# Patient Record
Sex: Male | Born: 1985 | Race: Black or African American | Hispanic: No | Marital: Single | Smoking: Never smoker
Health system: Southern US, Community
[De-identification: ages and names within clinical notes are randomized; demographics above are authoritative.]

## PROBLEM LIST (undated history)

## (undated) DIAGNOSIS — J45909 Unspecified asthma, uncomplicated: Secondary | ICD-10-CM

## (undated) HISTORY — DX: Unspecified asthma, uncomplicated: J45.909

---

## 2013-08-18 ENCOUNTER — Emergency Department (HOSPITAL_COMMUNITY)
Admission: EM | Admit: 2013-08-18 | Discharge: 2013-08-18 | Disposition: A | Discharge status: Home / Self Care | Payer: Self-pay | Attending: Emergency Medicine | Admitting: Emergency Medicine

## 2013-08-18 ENCOUNTER — Encounter (HOSPITAL_COMMUNITY): Payer: Self-pay

## 2013-08-18 DIAGNOSIS — L0291 Cutaneous abscess, unspecified: Secondary | ICD-10-CM

## 2013-08-18 DIAGNOSIS — L02818 Cutaneous abscess of other sites: Secondary | ICD-10-CM | POA: Insufficient documentation

## 2013-08-18 MED ORDER — SULFAMETHOXAZOLE-TRIMETHOPRIM 800-160 MG PO TABS: 1.0000 | ORAL_TABLET | Freq: Two times a day (BID) | ORAL | Status: DC

## 2013-08-18 NOTE — ED Notes (Signed)
Pt reports knot to the back of his neck for the past 3 days.  Pt denies any pain to the area.  Pt reports some headaches since he noticed it.  Pt denies any injury to the area.

## 2013-08-18 NOTE — ED Provider Notes (Signed)
CSN: [id number]     Arrival date & time 08/18/13  0941 History   First MD Initiated Contact with Patient 08/18/13 1219     No chief complaint on file.  (Consider location/radiation/quality/duration/timing/severity/associated sxs/prior Treatment) The history is provided by the patient.   Louis Ivery is a 27 y.o. male who presents to the ED with an area to the back of his neck at his hair line that he noticed 3 days ago. The area is tender and has gotten larger over the past few days.  He denies any other problems. History reviewed. No pertinent past medical history. History reviewed. No pertinent past surgical history. No family history on file. History  Substance Use Topics  . Smoking status: Never Smoker   . Smokeless tobacco: Not on file  . Alcohol Use: Yes    Review of Systems  Constitutional: Negative for fever and chills.  HENT: Negative for neck pain.   Eyes: Negative for visual disturbance.  Respiratory: Negative for shortness of breath.   Gastrointestinal: Negative for nausea and vomiting.  Skin:       Knot back of head  Neurological: Negative for headaches.  Psychiatric/Behavioral: The patient is not nervous/anxious.     Allergies  Review of patient's allergies indicates no known allergies.  Home Medications  No current outpatient prescriptions on file. BP 139/91  Pulse 62  Temp(Src) 98.3 F (36.8 C) (Oral)  Resp 18  Ht 6' 2.5" (1.892 m)  Wt 289 lb (131.09 kg)  BMI 36.62 kg/m2  SpO2 100% Physical Exam  Nursing note and vitals reviewed. Constitutional: He is oriented to person, place, and time. He appears well-developed and well-nourished.  HENT:  Head:    There is a firm, cystic like area palpated at the hair line of the back of the head.   Eyes: EOM are normal.  Neck: Neck supple.  Cardiovascular: Normal rate.   Pulmonary/Chest: Effort normal.  Abdominal: Soft. There is no tenderness.  Musculoskeletal: Normal range of motion.  Neurological:  He is alert and oriented to person, place, and time. No cranial nerve deficit.  Skin: Skin is warm and dry.  Psychiatric: He has a normal mood and affect. His behavior is normal.   BP 139/91  Pulse 62  Temp(Src) 98.3 F (36.8 C) (Oral)  Resp 18  Ht 6' 2.5" (1.892 m)  Wt 289 lb (131.09 kg)  BMI 36.62 kg/m2  SpO2 100%  ED Course  Procedures  MDM  27 y.o. male with cystic like area to the back of his head at the hair line. Possible early abscess. Discussed with the patient applying warm wet compresses to the area ans will start antibiotics. If he area worsens he will return. BP rechecked and was 139/91. Discussed with the patient need for follow up. He voices understanding.    Medication List         sulfamethoxazole-trimethoprim 800-160 MG per tablet  Commonly known as:  SEPTRA DS  Take 1 tablet by mouth every 12 (twelve) hours.           [facility] Orlene Och, NP 08/19/13 1728

## 2013-08-20 NOTE — ED Provider Notes (Signed)
Medical screening examination/treatment/procedure(s) were performed by non-physician practitioner and as supervising physician I was immediately available for consultation/collaboration.   Shelda Jakes, MD 08/20/13 [phone]

## 2015-11-17 ENCOUNTER — Encounter (HOSPITAL_COMMUNITY): Payer: Self-pay | Admitting: *Deleted

## 2015-11-17 ENCOUNTER — Emergency Department (HOSPITAL_COMMUNITY)
Admission: EM | Admit: 2015-11-17 | Discharge: 2015-11-17 | Disposition: A | Discharge status: Home / Self Care | Payer: Self-pay | Attending: Emergency Medicine | Admitting: Emergency Medicine

## 2015-11-17 DIAGNOSIS — K648 Other hemorrhoids: Secondary | ICD-10-CM | POA: Insufficient documentation

## 2015-11-17 DIAGNOSIS — R300 Dysuria: Secondary | ICD-10-CM | POA: Insufficient documentation

## 2015-11-17 LAB — URINALYSIS, ROUTINE W REFLEX MICROSCOPIC
Bilirubin Urine: NEGATIVE
Glucose, UA: NEGATIVE mg/dL
Hgb urine dipstick: NEGATIVE
Ketones, ur: NEGATIVE mg/dL
Leukocytes, UA: NEGATIVE
Nitrite: NEGATIVE
Protein, ur: NEGATIVE mg/dL
Specific Gravity, Urine: 1.028 (ref 1.005–1.030)
pH: 6 (ref 5.0–8.0)

## 2015-11-17 MED ORDER — DOCUSATE SODIUM 100 MG PO CAPS: 100.0000 mg | ORAL_CAPSULE | Freq: Two times a day (BID) | ORAL | Status: DC

## 2015-11-17 MED ORDER — STARCH 51 % RE SUPP: RECTAL | Status: DC

## 2015-11-17 NOTE — ED Notes (Signed)
Patient verbalized understanding of discharge instructions and denies any further needs or questions at this time. VS stable. Patient ambulatory with steady gait.  

## 2015-11-17 NOTE — Discharge Instructions (Signed)
Push fluids, stay hydrated. Suppositories 2 times per day to reduce inflammation and bleeding from hemorrhoid. Colace as a stool softener.  Dysuria Dysuria is pain or discomfort while urinating. The pain or discomfort may be felt in the tube that carries urine out of the bladder (urethra) or in the surrounding tissue of the genitals. The pain may also be felt in the groin area, lower abdomen, and lower back. You may have to urinate frequently or have the sudden feeling that you have to urinate (urgency). Dysuria can affect both men and women, but is more common in women. Dysuria can be caused by many different things, including:  Urinary tract infection in women.  Infection of the kidney or bladder.  Kidney stones or bladder stones.  Certain sexually transmitted infections (STIs), such as chlamydia.  Dehydration.  Inflammation of the vagina.  Use of certain medicines.  Use of certain soaps or scented products that cause irritation. HOME CARE INSTRUCTIONS Watch your dysuria for any changes. The following actions may help to reduce any discomfort you are feeling:  Drink enough fluid to keep your urine clear or pale yellow.  Empty your bladder often. Avoid holding urine for long periods of time.  After a bowel movement or urination, women should cleanse from front to back, using each tissue only once.  Empty your bladder after sexual intercourse.  Take medicines only as directed by your health care provider.  If you were prescribed an antibiotic medicine, finish it all even if you start to feel better.  Avoid caffeine, tea, and alcohol. They can irritate the bladder and make dysuria worse. In men, alcohol may irritate the prostate.  Keep all follow-up visits as directed by your health care provider. This is important.  If you had any tests done to find the cause of dysuria, it is your responsibility to obtain your test results. Ask the lab or department performing the test when  and how you will get your results. Talk with your health care provider if you have any questions about your results. SEEK MEDICAL CARE IF:  You develop pain in your back or sides.  You have a fever.  You have nausea or vomiting.  You have blood in your urine.  You are not urinating as often as you usually do. SEEK IMMEDIATE MEDICAL CARE IF:  You pain is severe and not relieved with medicines.  You are unable to hold down any fluids.  You or someone else notices a change in your mental function.  You have a rapid heartbeat at rest.  You have shaking or chills.  You feel extremely weak.   This information is not intended to replace advice given to you by your health care provider. Make sure you discuss any questions you have with your health care provider.   Document Released: 08/04/2004 Document Revised: 11/27/2014 Document Reviewed: 07/02/2014 Elsevier Interactive Patient Education 2016 [employer]  Hemorrhoids Hemorrhoids are puffy (swollen) veins around the rectum or anus. Hemorrhoids can cause pain, itching, bleeding, or irritation. HOME CARE  Eat foods with fiber, such as whole grains, beans, nuts, fruits, and vegetables. Ask your doctor about taking products with added fiber in them (fibersupplements).  Drink enough fluid to keep your pee (urine) clear or pale yellow.  Exercise often.  Go to the bathroom when you have the urge to poop. Do not wait.  Avoid straining to poop (bowel movement).  Keep the butt area dry and clean. Use wet toilet paper or moist paper towels.  Medicated creams and medicine inserted into the anus (anal suppository) may be used or applied as told.  Only take medicine as told by your doctor.  Take a warm water bath (sitz bath) for 15-20 minutes to ease pain. Do this 3-4 times a day.  Place ice packs on the area if it is tender or puffy. Use the ice packs between the warm water baths.  Put ice in a plastic bag.  Place a towel  between your skin and the bag.  Leave the ice on for 15-20 minutes, 03-04 times a day.  Do not use a donut-shaped pillow or sit on the toilet for a long time. GET HELP RIGHT AWAY IF:   You have more pain that is not controlled by treatment or medicine.  You have bleeding that will not stop.  You have trouble or are unable to poop (bowel movement).  You have pain or puffiness outside the area of the hemorrhoids. MAKE SURE YOU:   Understand these instructions.  Will watch your condition.  Will get help right away if you are not doing well or get worse.   This information is not intended to replace advice given to you by your health care provider. Make sure you discuss any questions you have with your health care provider.   Document Released: 08/15/2008 Document Revised: 10/23/2012 Document Reviewed: 09/17/2012 Elsevier Interactive Patient Education [employer]

## 2015-11-17 NOTE — ED Provider Notes (Signed)
CSN: [id number]     Arrival date & time 11/17/15  1544 History   First MD Initiated Contact with Patient 11/17/15 1951     Chief Complaint  Patient presents with  . Dysuria  . Rectal Bleeding     HPI  Patient presents for evaluation of 2 complaints one is that he had a large bowel movement today no some blood on the toilet paper afterwards. Second is at the last 2-3 days he states he has a "very mild stinging start to urinate". He is not urinating frequently. No nocturia. No hematuria. No urethral discharge. No dyspareunia. Does not have abdominal pain and fevers chills nausea vomiting or diarrhea. His never had hemorrhoids before  History reviewed. No pertinent past medical history. History reviewed. No pertinent past surgical history. History reviewed. No pertinent family history. Social History  Substance Use Topics  . Smoking status: Never Smoker   . Smokeless tobacco: None  . Alcohol Use: Yes    Review of Systems  Constitutional: Negative for fever, chills, diaphoresis, appetite change and fatigue.  HENT: Negative for mouth sores, sore throat and trouble swallowing.   Eyes: Negative for visual disturbance.  Respiratory: Negative for cough, chest tightness, shortness of breath and wheezing.   Cardiovascular: Negative for chest pain.  Gastrointestinal: Positive for anal bleeding. Negative for nausea, vomiting, abdominal pain, diarrhea and abdominal distention.  Endocrine: Negative for polydipsia, polyphagia and polyuria.  Genitourinary: Positive for dysuria. Negative for frequency and hematuria.  Musculoskeletal: Negative for gait problem.  Skin: Negative for color change, pallor and rash.  Neurological: Negative for dizziness, syncope, light-headedness and headaches.  Hematological: Does not bruise/bleed easily.  Psychiatric/Behavioral: Negative for behavioral problems and confusion.      Allergies  Review of patient's allergies indicates no known allergies.  Home  Medications   Prior to Admission medications   Medication Sig Start Date End Date Taking? Authorizing Provider  docusate sodium (COLACE) 100 MG capsule Take 1 capsule (100 mg total) by mouth every 12 (twelve) hours. 11/17/15   Rolland PorterMark Scot Shiraishi, MD  starch (ANUSOL) 51 % suppository 1 PR bid until bleeding resolves 11/17/15   Rolland PorterMark Aidric Endicott, MD  sulfamethoxazole-trimethoprim (SEPTRA DS) 800-160 MG per tablet Take 1 tablet by mouth every 12 (twelve) hours. 08/18/13   Hope Orlene OchM Neese, NP   BP 124/84 mmHg  Pulse 57  Temp(Src) 98.2 F (36.8 C) (Oral)  SpO2 97% Physical Exam  Constitutional: He is oriented to person, place, and time. He appears well-developed and well-nourished. No distress.  HENT:  Head: Normocephalic.  Eyes: Conjunctivae are normal. Pupils are equal, round, and reactive to light. No scleral icterus.  Neck: Normal range of motion. Neck supple. No thyromegaly present.  Cardiovascular: Normal rate and regular rhythm.  Exam reveals no gallop and no friction rub.   No murmur heard. Pulmonary/Chest: Effort normal and breath sounds normal. No respiratory distress. He has no wheezes. He has no rales.  Abdominal: Soft. Bowel sounds are normal. He exhibits no distension. There is no tenderness. There is no rebound.  Genitourinary:     Musculoskeletal: Normal range of motion.  Neurological: He is alert and oriented to person, place, and time.  Skin: Skin is warm and dry. No rash noted.  Psychiatric: He has a normal mood and affect. His behavior is normal.    ED Course  Procedures (including critical care time) Labs Review Labs Reviewed  URINALYSIS, ROUTINE W REFLEX MICROSCOPIC (NOT AT [facility])    Imaging Review No results found. I  have personally reviewed and evaluated these images and lab results as part of my medical decision-making.   EKG Interpretation None      MDM   Final diagnoses:  Internal hemorrhoid  Dysuria    Urine somewhat concentrated. No cells or sign of  infection. Less into push fluids and stay hydrated. He denies discharge and declines swab. Anusal suppositories and Colace. Stay hydrated. Primary care follow-up.    Rolland Porter, MD 11/17/15 2052

## 2015-11-17 NOTE — ED Notes (Signed)
Pt reports noticing blood after a bowel movement. Pt states that this has happened in the past. Pt also reports burning with urination.

## 2017-01-15 ENCOUNTER — Emergency Department (HOSPITAL_COMMUNITY)
Admission: EM | Admit: 2017-01-15 | Discharge: 2017-01-16 | Disposition: A | Discharge status: Home / Self Care | Payer: Self-pay | Attending: Emergency Medicine | Admitting: Emergency Medicine

## 2017-01-15 ENCOUNTER — Encounter (HOSPITAL_COMMUNITY): Payer: Self-pay

## 2017-01-15 DIAGNOSIS — E860 Dehydration: Secondary | ICD-10-CM | POA: Insufficient documentation

## 2017-01-15 DIAGNOSIS — R112 Nausea with vomiting, unspecified: Secondary | ICD-10-CM

## 2017-01-15 MED ORDER — SODIUM CHLORIDE 0.9 % IV BOLUS (SEPSIS)
1000.0000 mL | Freq: Once | INTRAVENOUS | Status: AC
  Administered 2017-01-15: 1000 mL via INTRAVENOUS

## 2017-01-15 MED ORDER — ONDANSETRON HCL 4 MG/2ML IJ SOLN
4.0000 mg | Freq: Once | INTRAMUSCULAR | Status: AC
  Administered 2017-01-15: 4 mg via INTRAVENOUS
  Filled 2017-01-15: qty 2

## 2017-01-15 NOTE — ED Provider Notes (Signed)
[facility]-EMERGENCY DEPT Provider Note   CSN: [id number] Arrival date & time: 01/15/17  2204 By signing my name below, I, Levon HedgerElizabeth Hall, attest that this documentation has been prepared under the direction and in the presence of Glynn OctaveStephen Earley Grobe, MD . Electronically Signed: Levon HedgerElizabeth Hall, Scribe. 01/15/2017. 11:45 PM.   History   Chief Complaint Chief Complaint  Patient presents with  . Emesis   HPI James Klein is a 31 y.o. male who presents to the Emergency Department complaining of intermittent vomiting which began at noon today. Per pt, he has had five episodes of NBNB emesis today. Pt last vomited 6 hours ago and has been able to keep water down since that time.  He notes associated intermittent cramping abdominal pain, chills, and headache. No alleviating or modifying factors noted.  No treatments tried PTA. No recent foreign travel. The patient is currently on no regular medications. He denies any diarrhea, dizziness, lightheadedness, dysuria, hematuria, or testicular pain. He is not currently followed by a PCP.   The history is provided by the patient. No language interpreter was used.    History reviewed. No pertinent past medical history.  There are no active problems to display for this patient.   History reviewed. No pertinent surgical history.   Home Medications    Prior to Admission medications   Medication Sig Start Date End Date Taking? Authorizing Provider  docusate sodium (COLACE) 100 MG capsule Take 1 capsule (100 mg total) by mouth every 12 (twelve) hours. 11/17/15   Rolland PorterMark James, MD  starch (ANUSOL) 51 % suppository 1 PR bid until bleeding resolves 11/17/15   Rolland PorterMark James, MD  sulfamethoxazole-trimethoprim (SEPTRA DS) 800-160 MG per tablet Take 1 tablet by mouth every 12 (twelve) hours. 08/18/13   Hope Orlene OchM Neese, NP    Family History No family history on file.  Social History Social History  Substance Use Topics  . Smoking status: Never Smoker  . Smokeless  tobacco: Never Used  . Alcohol use Yes     Allergies   Patient has no known allergies.   Review of Systems Review of Systems 10 systems reviewed and all are negative for acute change except as noted in the HPI.  Physical Exam Updated Vital Signs BP 131/90 (BP Location: Right Arm)   Pulse 101   Temp 98.5 F (36.9 C) (Oral)   Resp 18   Ht 6\' 2"  (1.88 m)   Wt 280 lb (127 kg)   SpO2 98%   BMI 35.95 kg/m   Physical Exam  Constitutional: He is oriented to person, place, and time. He appears well-developed and well-nourished. No distress.  HENT:  Head: Normocephalic and atraumatic.  Mouth/Throat: Oropharynx is clear and moist. No oropharyngeal exudate.  Eyes: Conjunctivae and EOM are normal. Pupils are equal, round, and reactive to light.  Neck: Normal range of motion. Neck supple.  No meningismus.  Cardiovascular: Normal rate, regular rhythm, normal heart sounds and intact distal pulses.   No murmur heard. Pulmonary/Chest: Effort normal and breath sounds normal. No respiratory distress.  Abdominal: Soft. There is tenderness. There is no rebound and no guarding.  MIld epigastric tenderness, No RLQ tenderness  Musculoskeletal: Normal range of motion. He exhibits no edema or tenderness.  Neurological: He is alert and oriented to person, place, and time. No cranial nerve deficit. He exhibits normal muscle tone. Coordination normal.   5/5 strength throughout. CN 2-12 intact.Equal grip strength.   Skin: Skin is warm.  Psychiatric: He has a normal mood and affect.  His behavior is normal.  Nursing note and vitals reviewed.  ED Treatments / Results  DIAGNOSTIC STUDIES:  Oxygen Saturation is 99% on RA, normal by my interpretation.    COORDINATION OF CARE:  11:30 PM Discussed treatment plan with pt at bedside and pt agreed to plan.  Labs (all labs ordered are listed, but only abnormal results are displayed) Labs Reviewed  CBC WITH DIFFERENTIAL/PLATELET - Abnormal; Notable for  the following:       Result Value   Lymphs Abs 0.6 (*)    All other components within normal limits  COMPREHENSIVE METABOLIC PANEL - Abnormal; Notable for the following:    Creatinine, Ser 1.42 (*)    AST 48 (*)    Total Bilirubin 1.6 (*)    All other components within normal limits  URINALYSIS, ROUTINE W REFLEX MICROSCOPIC - Abnormal; Notable for the following:    Color, Urine AMBER (*)    APPearance TURBID (*)    Specific Gravity, Urine 1.036 (*)    Protein, ur 30 (*)    Squamous Epithelial / LPF 0-5 (*)    All other components within normal limits  LIPASE, BLOOD    EKG  EKG Interpretation None       Radiology No results found.  Procedures Procedures (including critical care time)  Medications Ordered in ED Medications - No data to display   Initial Impression / Assessment and Plan / ED Course  I have reviewed the triage vital signs and the nursing notes.  Pertinent labs & imaging results that were available during my care of the patient were reviewed by me and considered in my medical decision making (see chart for details).     Nausea, vomiting, upper abdominal cramping since this morning. No sick contacts.  Patient given IV fluids and antiemetics. Abdomen is benign. No peritoneal signs. Labs show mild creatinine elevation of 1.4. No comparison. Patient tolerating by mouth in ED without any episodes of vomiting. Suspect viral or food-related illness. Discussed supportive care at home, by mouth hydration, antiemetics. Needs to establish care with PCP for recheck of kidney function. Return precautions discussed.     Final Clinical Impressions(s) / ED Diagnoses   Final diagnoses:  Non-intractable vomiting with nausea, unspecified vomiting type  Dehydration    New Prescriptions New Prescriptions   No medications on file  I personally performed the services described in this documentation, which was scribed in my presence. The recorded information has been  reviewed and is accurate.    Glynn Octave, MD 01/16/17 [phone]

## 2017-01-15 NOTE — ED Triage Notes (Signed)
Emesis x 5 starting at 0900 today. Pt denies diarrhea. Pt endorses abd cramping that comes and goes.

## 2017-01-16 LAB — CBC WITH DIFFERENTIAL/PLATELET
Basophils Absolute: 0 10*3/uL (ref 0.0–0.1)
Basophils Relative: 0 %
Eosinophils Absolute: 0 10*3/uL (ref 0.0–0.7)
Eosinophils Relative: 0 %
HCT: 43.9 % (ref 39.0–52.0)
Hemoglobin: 15.1 g/dL (ref 13.0–17.0)
Lymphocytes Relative: 12 %
Lymphs Abs: 0.6 10*3/uL — ABNORMAL LOW (ref 0.7–4.0)
MCH: 32.8 pg (ref 26.0–34.0)
MCHC: 34.4 g/dL (ref 30.0–36.0)
MCV: 95.2 fL (ref 78.0–100.0)
Monocytes Absolute: 0.3 10*3/uL (ref 0.1–1.0)
Monocytes Relative: 5 %
Neutro Abs: 3.9 10*3/uL (ref 1.7–7.7)
Neutrophils Relative %: 83 %
Platelets: 191 10*3/uL (ref 150–400)
RBC: 4.61 MIL/uL (ref 4.22–5.81)
RDW: 13.3 % (ref 11.5–15.5)
WBC: 4.8 10*3/uL (ref 4.0–10.5)

## 2017-01-16 LAB — COMPREHENSIVE METABOLIC PANEL
ALT: 32 U/L (ref 17–63)
AST: 48 U/L — ABNORMAL HIGH (ref 15–41)
Albumin: 4.4 g/dL (ref 3.5–5.0)
Alkaline Phosphatase: 62 U/L (ref 38–126)
Anion gap: 9 (ref 5–15)
BUN: 20 mg/dL (ref 6–20)
CO2: 26 mmol/L (ref 22–32)
Calcium: 9.1 mg/dL (ref 8.9–10.3)
Chloride: 104 mmol/L (ref 101–111)
Creatinine, Ser: 1.42 mg/dL — ABNORMAL HIGH (ref 0.61–1.24)
GFR calc Af Amer: >60 mL/min (ref >60)
GFR calc non Af Amer: >60 mL/min (ref >60)
Glucose, Bld: 96 mg/dL (ref 65–99)
Potassium: 3.9 mmol/L (ref 3.5–5.1)
Sodium: 139 mmol/L (ref 135–145)
Total Bilirubin: 1.6 mg/dL — ABNORMAL HIGH (ref 0.3–1.2)
Total Protein: 7.8 g/dL (ref 6.5–8.1)

## 2017-01-16 LAB — URINALYSIS, ROUTINE W REFLEX MICROSCOPIC
Bacteria, UA: NONE SEEN
Bilirubin Urine: NEGATIVE
Glucose, UA: NEGATIVE mg/dL
Hgb urine dipstick: NEGATIVE
Ketones, ur: NEGATIVE mg/dL
Leukocytes, UA: NEGATIVE
Nitrite: NEGATIVE
Protein, ur: 30 mg/dL — AB
Specific Gravity, Urine: 1.036 — ABNORMAL HIGH (ref 1.005–1.030)
pH: 5 (ref 5.0–8.0)

## 2017-01-16 LAB — LIPASE, BLOOD: Lipase: 23 U/L (ref 11–51)

## 2017-01-16 MED ORDER — ONDANSETRON HCL 4 MG PO TABS: 4.0000 mg | ORAL_TABLET | Freq: Three times a day (TID) | ORAL | 0 refills | Status: DC | PRN

## 2017-01-16 NOTE — ED Notes (Signed)
Patient tolerated fluids with no complication

## 2017-01-16 NOTE — Discharge Instructions (Signed)
Keep yourself hydrated. Use the nausea medication as prescribed. Follow up with a primary doctor for a recheck of your kidney function.

## 2017-06-28 ENCOUNTER — Encounter (HOSPITAL_COMMUNITY): Payer: Self-pay | Admitting: Emergency Medicine

## 2017-06-28 ENCOUNTER — Ambulatory Visit (HOSPITAL_COMMUNITY)
Admission: EM | Admit: 2017-06-28 | Discharge: 2017-06-28 | Disposition: A | Discharge status: Home / Self Care | Payer: 59 | Attending: Internal Medicine | Admitting: Internal Medicine

## 2017-06-28 DIAGNOSIS — R519 Headache, unspecified: Secondary | ICD-10-CM

## 2017-06-28 DIAGNOSIS — R51 Headache: Secondary | ICD-10-CM | POA: Diagnosis not present

## 2017-06-28 MED ORDER — FLUTICASONE PROPIONATE 50 MCG/ACT NA SUSP: 2.0000 | Freq: Every day | NASAL | 0 refills | Status: DC

## 2017-06-28 MED ORDER — KETOROLAC TROMETHAMINE 60 MG/2ML IM SOLN
60.0000 mg | Freq: Once | INTRAMUSCULAR | Status: AC
  Administered 2017-06-28: 60 mg via INTRAMUSCULAR

## 2017-06-28 MED ORDER — NAPROXEN 500 MG PO TABS: 500.0000 mg | ORAL_TABLET | Freq: Two times a day (BID) | ORAL | 0 refills | Status: AC

## 2017-06-28 MED ORDER — CETIRIZINE-PSEUDOEPHEDRINE ER 5-120 MG PO TB12: 1.0000 | ORAL_TABLET | Freq: Every day | ORAL | 0 refills | Status: DC

## 2017-06-28 MED ORDER — KETOROLAC TROMETHAMINE 60 MG/2ML IM SOLN
INTRAMUSCULAR | Status: AC
  Filled 2017-06-28: qty 2

## 2017-06-28 NOTE — ED Provider Notes (Signed)
[facility]    CSN: [id number] Arrival date & time: 06/28/17  1356     History   Chief Complaint Chief Complaint  Patient presents with  . Headache    HPI James Klein is a 31 y.o. male.   31 year old male comes in for 1 day history of headache. Headache is frontal in nature, throbbing, constant. Has not tried anything for the headache. No aggravating or alleviating factors found.Denies photophobia, phonophobia, nausea, vomiting. Denies URI symptoms such as cough, sore throat, congestion. Denies seasonal allergies. Denies fever, chills, night sweats. Denies ear pain, eye pain. Patient is worried that his headache could be due to high blood pressure, as it runs in the family.      History reviewed. No pertinent past medical history.  There are no active problems to display for this patient.   History reviewed. No pertinent surgical history.     Home Medications    Prior to Admission medications   Medication Sig Start Date End Date Taking? Authorizing Provider  cetirizine-pseudoephedrine (ZYRTEC-D) 5-120 MG tablet Take 1 tablet by mouth daily. 06/28/17   Cathie Hoops, Breandan People V, PA-C  fluticasone (FLONASE) 50 MCG/ACT nasal spray Place 2 sprays into both nostrils daily. 06/28/17   Cathie Hoops, Eulalia Ellerman V, PA-C  naproxen (NAPROSYN) 500 MG tablet Take 1 tablet (500 mg total) by mouth 2 (two) times daily. 06/28/17 07/08/17  Belinda Fisher, PA-C    Family History History reviewed. No pertinent family history.  Social History Social History  Substance Use Topics  . Smoking status: Never Smoker  . Smokeless tobacco: Never Used  . Alcohol use Yes     Allergies   Patient has no known allergies.   Review of Systems Review of Systems  Reason unable to perform ROS: as per HPI.     Physical Exam Triage Vital Signs ED Triage Vitals  Enc Vitals Group     BP 06/28/17 1447 (!) 143/100     Pulse Rate 06/28/17 1447 66     Resp 06/28/17 1447 14     Temp 06/28/17 1447 97.8 F (36.6 C)   Temp Source 06/28/17 1447 Oral     SpO2 06/28/17 1447 100 %     Weight 06/28/17 1448 (!) 304 lb (137.9 kg)     Height 06/28/17 1448 6' 2.5" (1.892 m)     Head Circumference --      Peak Flow --      Pain Score 06/28/17 1453 8     Pain Loc --      Pain Edu? --      Excl. in GC? --    No data found.   Updated Vital Signs BP (!) 143/100 (BP Location: Right Arm)   Pulse 66   Temp 97.8 F (36.6 C) (Oral)   Resp 14   Ht 6' 2.5" (1.892 m)   Wt (!) 304 lb (137.9 kg)   SpO2 100%   BMI 38.51 kg/m   Visual Acuity Right Eye Distance:   Left Eye Distance:   Bilateral Distance:    Right Eye Near:   Left Eye Near:    Bilateral Near:     Physical Exam  Constitutional: He is oriented to person, place, and time. He appears well-developed and well-nourished. No distress.  HENT:  Head: Normocephalic and atraumatic.  Right Ear: External ear and ear canal normal. Tympanic membrane is not erythematous and not bulging. A middle ear effusion is present.  Left Ear: External ear and ear canal normal.  Tympanic membrane is not erythematous and not bulging. A middle ear effusion is present.  Nose: Right sinus exhibits frontal sinus tenderness. Right sinus exhibits no maxillary sinus tenderness. Left sinus exhibits frontal sinus tenderness. Left sinus exhibits no maxillary sinus tenderness.  Mouth/Throat: Uvula is midline, oropharynx is clear and moist and mucous membranes are normal.  Eyes: Pupils are equal, round, and reactive to light. Conjunctivae are normal.  Neck: Normal range of motion. Neck supple.  Cardiovascular: Normal rate, regular rhythm and normal heart sounds.  Exam reveals no gallop and no friction rub.   No murmur heard. Pulmonary/Chest: Effort normal and breath sounds normal. He has no decreased breath sounds. He has no wheezes. He has no rhonchi. He has no rales.  Lymphadenopathy:    He has no cervical adenopathy.  Neurological: He is alert and oriented to person, place, and  time.  Skin: Skin is warm and dry.  Psychiatric: He has a normal mood and affect. His behavior is normal. Judgment normal.     UC Treatments / Results  Labs (all labs ordered are listed, but only abnormal results are displayed) Labs Reviewed - No data to display  EKG  EKG Interpretation None       Radiology No results found.  Procedures Procedures (including critical care time)  Medications Ordered in UC Medications  ketorolac (TORADOL) injection 60 mg (60 mg Intramuscular Given 06/28/17 1555)     Initial Impression / Assessment and Plan / UC Course  I have reviewed the triage vital signs and the nursing notes.  Pertinent labs & imaging results that were available during my care of the patient were reviewed by me and considered in my medical decision making (see chart for details).     Discussed with patient history and exam most consistent with sinus headache due to nasal congestion and sinus pressure. Given patient's pain, offered Toradol injection in office, patient would like to proceed. Patient to start Zyrtec-D, Flonase for nasal congestion. Naproxen 500mg  as directed for headache. Attached resources for a change to help with high blood pressure. Patient to follow up with PCP for reevaluation of hypertension.  Final Clinical Impressions(s) / UC Diagnoses   Final diagnoses:  Sinus headache    New Prescriptions Discharge Medication List as of 06/28/2017  3:48 PM    START taking these medications   Details  cetirizine-pseudoephedrine (ZYRTEC-D) 5-120 MG tablet Take 1 tablet by mouth daily., Starting Thu 06/28/2017, Normal    fluticasone (FLONASE) 50 MCG/ACT nasal spray Place 2 sprays into both nostrils daily., Starting Thu 06/28/2017, Normal    naproxen (NAPROSYN) 500 MG tablet Take 1 tablet (500 mg total) by mouth 2 (two) times daily., Starting Thu 06/28/2017, Until Sun 07/08/2017, Normal          Jacoya Bauman V, PA-C 06/28/17 1829

## 2017-06-28 NOTE — Discharge Instructions (Signed)
You were given a toradol injection today to help with pain. Start naproxen for headache as directed. Start zyrtec-d and flonase for nasal congestion. Your headache could be due to stress, sinus pressure/congestion. I have attached information of diet change for high blood pressure. Follow up with PCP for evaluation of high blood pressure.

## 2017-06-28 NOTE — ED Triage Notes (Signed)
[facility] a [facility] headache that started this am.

## 2018-06-16 ENCOUNTER — Emergency Department (HOSPITAL_COMMUNITY)
Admission: EM | Admit: 2018-06-16 | Discharge: 2018-06-16 | Disposition: A | Discharge status: Home / Self Care | Payer: 59 | Attending: Emergency Medicine | Admitting: Emergency Medicine

## 2018-06-16 ENCOUNTER — Encounter (HOSPITAL_COMMUNITY): Payer: Self-pay

## 2018-06-16 ENCOUNTER — Other Ambulatory Visit: Payer: Self-pay

## 2018-06-16 DIAGNOSIS — M545 Low back pain, unspecified: Secondary | ICD-10-CM

## 2018-06-16 MED ORDER — NAPROXEN 500 MG PO TABS: 500.0000 mg | ORAL_TABLET | Freq: Two times a day (BID) | ORAL | 0 refills | Status: DC

## 2018-06-16 MED ORDER — NAPROXEN 500 MG PO TABS
500.0000 mg | ORAL_TABLET | Freq: Once | ORAL | Status: AC
  Administered 2018-06-16: 500 mg via ORAL
  Filled 2018-06-16: qty 1

## 2018-06-16 MED ORDER — LIDOCAINE 5 % EX PTCH
1.0000 | MEDICATED_PATCH | Freq: Once | CUTANEOUS | Status: DC
  Administered 2018-06-16: 1 via TRANSDERMAL
  Filled 2018-06-16: qty 1

## 2018-06-16 MED ORDER — CYCLOBENZAPRINE HCL 10 MG PO TABS: 10.0000 mg | ORAL_TABLET | Freq: Two times a day (BID) | ORAL | 0 refills | Status: DC | PRN

## 2018-06-16 NOTE — ED Provider Notes (Signed)
[facility] Emergency Department Provider Note MRN:  [id number]  Arrival date & time: 06/16/18     Chief Complaint   Back Pain   History of Present Illness   James Klein is a 32 y.o. year-old male with no pertinent past medical history presenting to the ED with chief complaint of back pain.  The pain is located in the central lumbar back, began gradually 2 to 3 days ago.  Patient lifts heavy trays at work, works at [employer].  Has been working more than usual for the past couple weeks.  The pain is described as dull, nonradiating, mild to moderate in severity.  Patient denies recent fever, no numbness or weakness in the arms or legs, no issues with bowel or bladder function.  Review of Systems  A complete 10 system review of systems was obtained and all systems are negative except as noted in the HPI and PMH.   Patient's Health History   History reviewed. No pertinent past medical history.  No past surgical history on file.  No family history on file.  Social History   Socioeconomic History  . Marital status: Single    Spouse name: Not on file  . Number of children: Not on file  . Years of education: Not on file  . Highest education level: Not on file  Occupational History  . Not on file  Social Needs  . Financial resource strain: Not on file  . Food insecurity:    Worry: Not on file    Inability: Not on file  . Transportation needs:    Medical: Not on file    Non-medical: Not on file  Tobacco Use  . Smoking status: Never Smoker  . Smokeless tobacco: Never Used  Substance and Sexual Activity  . Alcohol use: Yes  . Drug use: No  . Sexual activity: Not on file  Lifestyle  . Physical activity:    Days per week: Not on file    Minutes per session: Not on file  . Stress: Not on file  Relationships  . Social connections:    Talks on phone: Not on file    Gets together: Not on file    Attends religious service: Not on file    Active member of  club or organization: Not on file    Attends meetings of clubs or organizations: Not on file    Relationship status: Not on file  . Intimate partner violence:    Fear of current or ex partner: Not on file    Emotionally abused: Not on file    Physically abused: Not on file    Forced sexual activity: Not on file  Other Topics Concern  . Not on file  Social History Narrative  . Not on file     Physical Exam  Vital Signs and Nursing Notes reviewed Vitals:   06/16/18 0935 06/16/18 0957  BP: (!) 144/90 133/88  Pulse: 70 76  Resp: 18 18  Temp: 98.1 F (36.7 C) 98.7 F (37.1 C)  SpO2: 98% 98%    CONSTITUTIONAL: Well-appearing, NAD NEURO:  Alert and oriented x 3, no focal deficits EYES:  eyes equal and reactive ENT/NECK:  no LAD, no JVD CARDIO: Regular rate, well-perfused, normal S1 and S2 PULM:  CTAB no wheezing or rhonchi GI/GU:  normal bowel sounds, non-distended, non-tender MSK/SPINE:  No gross deformities, no edema SKIN:  no rash, atraumatic PSYCH:  Appropriate speech and behavior  Diagnostic and Interventional Summary  EKG Interpretation  Date/Time:    Ventricular Rate:    PR Interval:    QRS Duration:   QT Interval:    QTC Calculation:   R Axis:     Text Interpretation:        Labs Reviewed - No data to display  No orders to display    Medications  naproxen (NAPROSYN) tablet 500 mg (has no administration in time range)  lidocaine (LIDODERM) 5 % 1 patch (has no administration in time range)     Procedures Critical Care  ED Course and Medical Decision Making  I have reviewed the triage vital signs and the nursing notes.  Pertinent labs & imaging results that were available during my care of the patient were reviewed by me and considered in my medical decision making (see below for details).    Healthy 32 year old male here with nontraumatic low back pain, seems to be related to working more than usual, lifting heavy things at work.  No red flags  today, no neurological deficits, no bowel or bladder dysfunction.  Favoring MSK strain, will give prescription for Naprosyn and Flexeril, advised to follow-up with a primary care doctor.  Given lidocaine patch and Naprosyn here in the ED.  After the discussed management above, the patient was determined to be safe for discharge.  The patient was in agreement with this plan and all questions regarding their care were answered.  ED return precautions were discussed and the patient will return to the ED with any significant worsening of condition.  Elmer SowMichael M. Pilar PlateBero, MD [facility] [email or web address]  Final Clinical Impressions(s) / ED Diagnoses     ICD-10-CM   1. Acute midline low back pain without sciatica M54.5     ED Discharge Orders        Ordered    naproxen (NAPROSYN) 500 MG tablet  2 times daily     06/16/18 1201    cyclobenzaprine (FLEXERIL) 10 MG tablet  2 times daily PRN     06/16/18 1201         Sabas SousBero, Torrell Krutz M, MD 06/16/18 1221

## 2018-06-16 NOTE — Discharge Instructions (Signed)
[facility]  After careful evaluation, we did not find any emergent condition requiring admission or further testing in the hospital.  Your symptoms today seem to be due to muscle strain of the lower back.  Please take the medications provided as needed for pain.  Take a few days off of work to allow your back to heal.  Please return to the Emergency Department if you experience any worsening of your condition.  We encourage you to follow up with a primary care provider.  Thank you for allowing us to be a part of your care.

## 2018-06-16 NOTE — ED Triage Notes (Signed)
He c/o non-traumatic low back pain. He is in no distress.

## 2018-08-26 ENCOUNTER — Encounter: Payer: Self-pay | Admitting: Family Medicine

## 2018-08-26 ENCOUNTER — Ambulatory Visit (INDEPENDENT_AMBULATORY_CARE_PROVIDER_SITE_OTHER): Payer: BLUE CROSS/BLUE SHIELD | Admitting: Family Medicine

## 2018-08-26 VITALS — BP 118/80 | HR 73 | Temp 98.3°F | Ht 74.5 in | Wt 322.2 lb

## 2018-08-26 DIAGNOSIS — K5900 Constipation, unspecified: Secondary | ICD-10-CM | POA: Diagnosis not present

## 2018-08-26 DIAGNOSIS — Z6841 Body Mass Index (BMI) 40.0 and over, adult: Secondary | ICD-10-CM | POA: Diagnosis not present

## 2018-08-26 DIAGNOSIS — Z0000 Encounter for general adult medical examination without abnormal findings: Secondary | ICD-10-CM

## 2018-08-26 DIAGNOSIS — K625 Hemorrhage of anus and rectum: Secondary | ICD-10-CM

## 2018-08-26 LAB — CBC
HCT: 41.7 % (ref 39.0–52.0)
Hemoglobin: 14 g/dL (ref 13.0–17.0)
MCHC: 33.6 g/dL (ref 30.0–36.0)
MCV: 97.9 fl (ref 78.0–100.0)
Platelets: 205 10*3/uL (ref 150.0–400.0)
RBC: 4.25 Mil/uL (ref 4.22–5.81)
RDW: 13.1 % (ref 11.5–15.5)
WBC: 5 10*3/uL (ref 4.0–10.5)

## 2018-08-26 LAB — COMPREHENSIVE METABOLIC PANEL
ALT: 28 U/L (ref 0–53)
AST: 34 U/L (ref 0–37)
Albumin: 3.7 g/dL (ref 3.5–5.2)
Alkaline Phosphatase: 71 U/L (ref 39–117)
BUN: 14 mg/dL (ref 6–23)
CO2: 31 mEq/L (ref 19–32)
Calcium: 9 mg/dL (ref 8.4–10.5)
Chloride: 104 mEq/L (ref 96–112)
Creatinine, Ser: 1.31 mg/dL (ref 0.40–1.50)
GFR: 82.25 mL/min (ref >60.00)
Glucose, Bld: 92 mg/dL (ref 70–99)
Potassium: 4.3 mEq/L (ref 3.5–5.1)
Sodium: 139 mEq/L (ref 135–145)
Total Bilirubin: 0.5 mg/dL (ref 0.2–1.2)
Total Protein: 6.3 g/dL (ref 6.0–8.3)

## 2018-08-26 LAB — LIPID PANEL
Cholesterol: 198 mg/dL (ref 0–200)
HDL: 47.1 mg/dL (ref >39.00)
LDL Cholesterol: 130 mg/dL — ABNORMAL HIGH (ref 0–99)
NonHDL: 151.16
Total CHOL/HDL Ratio: 4
Triglycerides: 104 mg/dL (ref 0.0–149.0)
VLDL: 20.8 mg/dL (ref 0.0–40.0)

## 2018-08-26 LAB — TSH: TSH: 0.95 u[IU]/mL (ref 0.35–4.50)

## 2018-08-26 MED ORDER — HYDROCORTISONE ACETATE 25 MG RE SUPP: 25.0000 mg | Freq: Two times a day (BID) | RECTAL | 0 refills | Status: DC

## 2018-08-26 NOTE — Assessment & Plan Note (Signed)
-  Likely from internal hemorrhoids.   -Discussed need to stay well hydrated and intake adequate fiber to prevent constipation -Rx for anusol suppository.  -He will let me know if not improving.

## 2018-08-26 NOTE — Progress Notes (Signed)
James Klein - 32 y.o. male MRN [id number]  Date of birth: 03-05-1986  Subjective Chief Complaint  Patient presents with  . Rectal Bleeding    HPI James Klein is a 32 y.o. male here today with complaint of rectal bleeding.  Reports noticing a small amount of blood on his stool and when he wipes. This initially started in 2017, was seen in ED at that time and diagnosed with internal hemorrhoids.  Has occurred intermittently since that time.  He denies pain with bowel movements.  He does have some itching.  He is using OTC suppository as needed with some relief.  He admits to constipation and this tends to worsen if he is more constipated.  He denies abdominal pain, nausea, weight loss, fever, chills.  He has an older cousin with Crohns disease but denies family history of colon cancer.    ROS:  A comprehensive ROS was completed and negative except as noted per HPI  No Known Allergies  Past Medical History:  Diagnosis Date  . Asthma     History reviewed. No pertinent surgical history.  Social History   Socioeconomic History  . Marital status: Single    Spouse name: Not on file  . Number of children: Not on file  . Years of education: Not on file  . Highest education level: Not on file  Occupational History  . Not on file  Social Needs  . Financial resource strain: Not on file  . Food insecurity:    Worry: Not on file    Inability: Not on file  . Transportation needs:    Medical: Not on file    Non-medical: Not on file  Tobacco Use  . Smoking status: Never Smoker  . Smokeless tobacco: Never Used  Substance and Sexual Activity  . Alcohol use: Yes    Comment: occass.   . Drug use: No  . Sexual activity: Not on file  Lifestyle  . Physical activity:    Days per week: Not on file    Minutes per session: Not on file  . Stress: Not on file  Relationships  . Social connections:    Talks on phone: Not on file    Gets together: Not on file    Attends religious  service: Not on file    Active member of club or organization: Not on file    Attends meetings of clubs or organizations: Not on file    Relationship status: Not on file  Other Topics Concern  . Not on file  Social History Narrative  . Not on file    History reviewed. No pertinent family history.  Health Maintenance  Topic Date Due  . HIV Screening  12/22/2002  . TETANUS/TDAP  12/22/2006  . INFLUENZA VACCINE  06/20/2018    ----------------------------------------------------------------------------------------------------------------------------------------------------------------------------------------------------------------- Physical Exam BP 118/80   Pulse 73   Temp 98.3 F (36.8 C) (Oral)   Ht 6' 2.5" (1.892 m)   Wt (!) 322 lb 3.2 oz (146.1 kg)   SpO2 97%   BMI 40.81 kg/m   Physical Exam  Constitutional: He is oriented to person, place, and time. He appears well-nourished. No distress.  HENT:  Head: Normocephalic and atraumatic.  Mouth/Throat: Oropharynx is clear and moist.  Eyes: No scleral icterus.  Neck: Neck supple.  Cardiovascular: Normal rate, regular rhythm and normal heart sounds.  Pulmonary/Chest: Effort normal and breath sounds normal.  Abdominal: Soft. He exhibits no distension. There is no tenderness.  Genitourinary:  Genitourinary Comments: Small external  hemorrhoid noted.  Rectal exam deferred.   Neurological: He is alert and oriented to person, place, and time.  Skin: Skin is warm and dry. No rash noted.  Psychiatric: He has a normal mood and affect. His behavior is normal.    ------------------------------------------------------------------------------------------------------------------------------------------------------------------------------------------------------------------- Assessment and Plan  BRBPR (bright red blood per rectum) -Likely from internal hemorrhoids.   -Discussed need to stay well hydrated and intake adequate fiber to  prevent constipation -Rx for anusol suppository.  -He will let me know if not improving.   Constipation Increased fiber and fluid intake.    Is returning for CPE in the next couple of weeks, lab ordered today.

## 2018-08-26 NOTE — Assessment & Plan Note (Signed)
Increased fiber and fluid intake.

## 2018-08-26 NOTE — Patient Instructions (Signed)
-Be sure to intake plenty of fiber (green leafy vegetables, fruit with skin (apples, etc) or use a fiber supplement such as benefiber or metamucil.  -Drink plenty of water throughout the day -Use suppository as needed.    Hemorrhoids Hemorrhoids are swollen veins in and around the rectum or anus. There are two types of hemorrhoids:  Internal hemorrhoids. These occur in the veins that are just inside the rectum. They may poke through to the outside and become irritated and painful.  External hemorrhoids. These occur in the veins that are outside of the anus and can be felt as a painful swelling or hard lump near the anus.  Most hemorrhoids do not cause serious problems, and they can be managed with home treatments such as diet and lifestyle changes. If home treatments do not help your symptoms, procedures can be done to shrink or remove the hemorrhoids. What are the causes? This condition is caused by increased pressure in the anal area. This pressure may result from various things, including:  Constipation.  Straining to have a bowel movement.  Diarrhea.  Pregnancy.  Obesity.  Sitting for long periods of time.  Heavy lifting or other activity that causes you to strain.  Anal sex.  What are the signs or symptoms? Symptoms of this condition include:  Pain.  Anal itching or irritation.  Rectal bleeding.  Leakage of stool (feces).  Anal swelling.  One or more lumps around the anus.  How is this diagnosed? This condition can often be diagnosed through a visual exam. Other exams or tests may also be done, such as:  Examination of the rectal area with a gloved hand (digital rectal exam).  Examination of the anal canal using a small tube (anoscope).  A blood test, if you have lost a significant amount of blood.  A test to look inside the colon (sigmoidoscopy or colonoscopy).  How is this treated? This condition can usually be treated at home. However, various  procedures may be done if dietary changes, lifestyle changes, and other home treatments do not help your symptoms. These procedures can help make the hemorrhoids smaller or remove them completely. Some of these procedures involve surgery, and others do not. Common procedures include:  Rubber band ligation. Rubber bands are placed at the base of the hemorrhoids to cut off the blood supply to them.  Sclerotherapy. Medicine is injected into the hemorrhoids to shrink them.  Infrared coagulation. A type of light energy is used to get rid of the hemorrhoids.  Hemorrhoidectomy surgery. The hemorrhoids are surgically removed, and the veins that supply them are tied off.  Stapled hemorrhoidopexy surgery. A circular stapling device is used to remove the hemorrhoids and use staples to cut off the blood supply to them.  Follow these instructions at home: Eating and drinking  Eat foods that have a lot of fiber in them, such as whole grains, beans, nuts, fruits, and vegetables. Ask your health care provider about taking products that have added fiber (fiber supplements).  Drink enough fluid to keep your urine clear or pale yellow. Managing pain and swelling  Take warm sitz baths for 20 minutes, 3-4 times a day to ease pain and discomfort.  If directed, apply ice to the affected area. Using ice packs between sitz baths may be helpful. ? Put ice in a plastic bag. ? Place a towel between your skin and the bag. ? Leave the ice on for 20 minutes, 2-3 times a day. General instructions  Take over-the-counter  and prescription medicines only as told by your health care provider.  Use medicated creams or suppositories as told.  Exercise regularly.  Go to the bathroom when you have the urge to have a bowel movement. Do not wait.  Avoid straining to have bowel movements.  Keep the anal area dry and clean. Use wet toilet paper or moist towelettes after a bowel movement.  Do not sit on the toilet for  long periods of time. This increases blood pooling and pain. Contact a health care provider if:  You have increasing pain and swelling that are not controlled by treatment or medicine.  You have uncontrolled bleeding.  You have difficulty having a bowel movement, or you are unable to have a bowel movement.  You have pain or inflammation outside the area of the hemorrhoids. This information is not intended to replace advice given to you by your health care provider. Make sure you discuss any questions you have with your health care provider. Document Released: 11/03/2000 Document Revised: 04/05/2016 Document Reviewed: 07/21/2015 Elsevier Interactive Patient Education  [employer].

## 2018-08-29 NOTE — Progress Notes (Signed)
-  Cholesterol is a little elevated.  He should follow a healthy, low fat diet with regular exercise to improve this.  -Other labs are normal.

## 2018-08-30 ENCOUNTER — Telehealth: Payer: Self-pay | Admitting: Emergency Medicine

## 2018-08-30 NOTE — Telephone Encounter (Signed)
Copied from CRM [id number]. Topic: Quick Conservator, museum/gallery Patient (Clinic Use ONLY) >> Aug 29, 2018  1:39 PM Leafy Ro wrote: Reason for CRM: pt is unable to get into voicemail to check his message. Pt is calling back requesting blood work results   Spoke with patient regarding lab results. Patient understood and had no further concerns or questions

## 2018-09-09 ENCOUNTER — Ambulatory Visit (INDEPENDENT_AMBULATORY_CARE_PROVIDER_SITE_OTHER): Payer: BLUE CROSS/BLUE SHIELD | Admitting: Family Medicine

## 2018-09-09 ENCOUNTER — Encounter: Payer: Self-pay | Admitting: Family Medicine

## 2018-09-09 VITALS — BP 110/80 | HR 85 | Temp 98.0°F | Ht 74.5 in | Wt 325.4 lb

## 2018-09-09 DIAGNOSIS — M2142 Flat foot [pes planus] (acquired), left foot: Secondary | ICD-10-CM

## 2018-09-09 DIAGNOSIS — M25561 Pain in right knee: Secondary | ICD-10-CM | POA: Diagnosis not present

## 2018-09-09 DIAGNOSIS — M2141 Flat foot [pes planus] (acquired), right foot: Secondary | ICD-10-CM

## 2018-09-09 DIAGNOSIS — Z0000 Encounter for general adult medical examination without abnormal findings: Secondary | ICD-10-CM | POA: Diagnosis not present

## 2018-09-09 NOTE — Assessment & Plan Note (Signed)
Well adult Recent labs reviewed with him. Immunizations:  Declines Flu and Tdap Screenings: none indicated at this time.  Anticipatory guidance/Risk factor reduction:  Per AVS  Referral made to sports medicine for pes planus/knee pain.

## 2018-09-09 NOTE — Patient Instructions (Signed)

## 2018-09-09 NOTE — Progress Notes (Signed)
James Klein - 32 y.o. male MRN [id number]  Date of birth: 02/24/1986  Subjective Chief Complaint  Patient presents with  . Annual Exam    HPI James Klein is a 32 y.o. male here today for annual exam.  Seen previously for rectal bleeding 2/2 to hemorrhoids, this has resolved.  Reviewed recent labs with him.  Cholesterol noted to be elevated.  He has started making changes to diet and recently purchased a gym membership.  He also complains of flat feet and he thinks this is causing some R knee pain.  He denies swelling of the knee, locking or giving of the knee.  Reports history of patellar fracture in middle school.   Review of Systems  Constitutional: Negative for chills, fever, malaise/fatigue and weight loss.  HENT: Negative for congestion, ear pain and sore throat.   Eyes: Negative for blurred vision, double vision and pain.  Respiratory: Negative for cough and shortness of breath.   Cardiovascular: Negative for chest pain and palpitations.  Gastrointestinal: Negative for abdominal pain, blood in stool, constipation, heartburn and nausea.  Genitourinary: Negative for dysuria and urgency.  Musculoskeletal: Positive for joint pain. Negative for myalgias.  Neurological: Negative for dizziness and headaches.  Endo/Heme/Allergies: Does not bruise/bleed easily.  Psychiatric/Behavioral: Negative for depression. The patient is not nervous/anxious and does not have insomnia.     No Known Allergies  Past Medical History:  Diagnosis Date  . Asthma     No past surgical history on file.  Social History   Socioeconomic History  . Marital status: Single    Spouse name: Not on file  . Number of children: Not on file  . Years of education: Not on file  . Highest education level: Not on file  Occupational History  . Not on file  Social Needs  . Financial resource strain: Not on file  . Food insecurity:    Worry: Not on file    Inability: Not on file  . Transportation needs:     Medical: Not on file    Non-medical: Not on file  Tobacco Use  . Smoking status: Never Smoker  . Smokeless tobacco: Never Used  Substance and Sexual Activity  . Alcohol use: Yes    Comment: occass.   . Drug use: No  . Sexual activity: Not on file  Lifestyle  . Physical activity:    Days per week: Not on file    Minutes per session: Not on file  . Stress: Not on file  Relationships  . Social connections:    Talks on phone: Not on file    Gets together: Not on file    Attends religious service: Not on file    Active member of club or organization: Not on file    Attends meetings of clubs or organizations: Not on file    Relationship status: Not on file  Other Topics Concern  . Not on file  Social History Narrative  . Not on file    No family history on file.  Health Maintenance  Topic Date Due  . TETANUS/TDAP  12/22/2006  . INFLUENZA VACCINE  09/04/2019 (Originally 06/20/2018)  . HIV Screening  09/10/2019 (Originally 12/22/2002)    ----------------------------------------------------------------------------------------------------------------------------------------------------------------------------------------------------------------- Physical Exam BP 110/80   Pulse 85   Temp 98 F (36.7 C)   Ht 6' 2.5" (1.892 m)   Wt (!) 325 lb 6.4 oz (147.6 kg)   SpO2 97%   BMI 41.22 kg/m   Physical Exam  Constitutional: He is  oriented to person, place, and time. He appears well-nourished. No distress.  HENT:  Head: Normocephalic and atraumatic.  Right Ear: External ear normal.  Left Ear: External ear normal.  Mouth/Throat: Oropharynx is clear and moist.  Eyes: No scleral icterus.  Neck: Normal range of motion. No thyromegaly present.  Cardiovascular: Normal rate, regular rhythm, normal heart sounds and intact distal pulses.  Pulmonary/Chest: Effort normal and breath sounds normal.  Abdominal: Soft. Bowel sounds are normal. He exhibits no distension. There is no  tenderness. There is no guarding.  Musculoskeletal: He exhibits no edema.  B/L pes planus.  R knee normal to inspection and palpation without effusion.  ROM is normal. Negative meniscal provocation testing.  Negative patellar compression test. No ligament laxity.   Lymphadenopathy:    He has no cervical adenopathy.  Neurological: He is alert and oriented to person, place, and time. No cranial nerve deficit. He exhibits normal muscle tone.  Skin: Skin is warm and dry. No rash noted.  Psychiatric: He has a normal mood and affect. His behavior is normal.    ------------------------------------------------------------------------------------------------------------------------------------------------------------------------------------------------------------------- Assessment and Plan  Well adult exam Well adult Recent labs reviewed with him. Immunizations:  Declines Flu and Tdap Screenings: none indicated at this time.  Anticipatory guidance/Risk factor reduction:  Per AVS  Referral made to sports medicine for pes planus/knee pain.

## 2018-09-23 ENCOUNTER — Ambulatory Visit: Payer: BLUE CROSS/BLUE SHIELD | Admitting: Family Medicine

## 2018-10-07 ENCOUNTER — Encounter: Payer: Self-pay | Admitting: Family Medicine

## 2018-10-07 ENCOUNTER — Ambulatory Visit (INDEPENDENT_AMBULATORY_CARE_PROVIDER_SITE_OTHER): Payer: BLUE CROSS/BLUE SHIELD | Admitting: Family Medicine

## 2018-10-07 VITALS — BP 138/70 | HR 74 | Temp 97.7°F | Ht 74.5 in | Wt 328.0 lb

## 2018-10-07 DIAGNOSIS — M79671 Pain in right foot: Secondary | ICD-10-CM | POA: Diagnosis not present

## 2018-10-07 MED ORDER — DICLOFENAC SODIUM 2 % TD SOLN: 1.0000 "application " | Freq: Two times a day (BID) | TRANSDERMAL | 3 refills | Status: AC

## 2018-10-07 NOTE — Patient Instructions (Signed)
Nice to meet you  Please follow up with your boots and shoes so we can make custom orthotics  Please try the exercises  Please try the rub on medicine  Please try to ice your feet during work sometime

## 2018-10-07 NOTE — Progress Notes (Signed)
James Klein - 32 y.o. male MRN [id number]  Date of birth: 09/26/1986  SUBJECTIVE:  Including CC & ROS.  Chief Complaint  Patient presents with  . Foot Pain    right foot pain radiate to knee, pt has flatfeet    James Klein is a 32 y.o. male that is presenting with right foot pain.  He feels like he has radiation proximally to the medial side of his knee.  Pain is acute on chronic in nature.  The pain is worse at the end of the day.  It is intermittent in nature.  He has not done anything to improve the pain.  Denies any injury or inciting event.  The pain can be moderate in severity.  He feels the pain along the dorsal medial aspect of his midfoot.  He denies any surgeries previously.   Review of Systems  Constitutional: Negative for fever.  HENT: Negative for congestion.   Respiratory: Negative for cough.   Cardiovascular: Negative for chest pain.  Gastrointestinal: Negative for abdominal pain.  Musculoskeletal: Negative for joint swelling.  Skin: Negative for color change.  Neurological: Negative for weakness.  Hematological: Negative for adenopathy.  Psychiatric/Behavioral: Negative for agitation.    HISTORY: Past Medical, Surgical, Social, and Family History Reviewed & Updated per EMR.   Pertinent Historical Findings include:  Past Medical History:  Diagnosis Date  . Asthma     No past surgical history on file.  No Known Allergies  No family history on file.   Social History   Socioeconomic History  . Marital status: Single    Spouse name: Not on file  . Number of children: Not on file  . Years of education: Not on file  . Highest education level: Not on file  Occupational History  . Not on file  Social Needs  . Financial resource strain: Not on file  . Food insecurity:    Worry: Not on file    Inability: Not on file  . Transportation needs:    Medical: Not on file    Non-medical: Not on file  Tobacco Use  . Smoking status: Never Smoker  .  Smokeless tobacco: Never Used  Substance and Sexual Activity  . Alcohol use: Yes    Comment: occass.   . Drug use: No  . Sexual activity: Not on file  Lifestyle  . Physical activity:    Days per week: Not on file    Minutes per session: Not on file  . Stress: Not on file  Relationships  . Social connections:    Talks on phone: Not on file    Gets together: Not on file    Attends religious service: Not on file    Active member of club or organization: Not on file    Attends meetings of clubs or organizations: Not on file    Relationship status: Not on file  . Intimate partner violence:    Fear of current or ex partner: Not on file    Emotionally abused: Not on file    Physically abused: Not on file    Forced sexual activity: Not on file  Other Topics Concern  . Not on file  Social History Narrative  . Not on file     PHYSICAL EXAM:  VS: BP 138/70 (BP Location: Right Arm, Patient Position: Sitting, Cuff Size: Large)   Pulse 74   Temp 97.7 F (36.5 C) (Oral)   Ht 6' 2.5" (1.892 m)   Wt (!) 328 lb (  148.8 kg)   SpO2 96%   BMI 41.55 kg/m  Physical Exam Gen: NAD, alert, cooperative with exam, well-appearing ENT: normal lips, normal nasal mucosa,  Eye: normal EOM, normal conjunctiva and lids CV:  no edema, +2 pedal pulses   Resp: no accessory muscle use, non-labored,  Skin: no rashes, no areas of induration  Neuro: normal tone, normal sensation to touch Psych:  normal insight, alert and oriented MSK:  Right foot: Pes planus of the right foot. Normal range of motion. Normal strength resistance. No abnormal ulcers or calluses. Neurovascular intact     ASSESSMENT & PLAN:   Right foot pain Pain likely result of his flat her feet.  This could cause an alignment issue.  His right foot seems to be worse than his left foot. -Pennsaid. -Can consider making custom orthotics and he can follow-up to have these made. -Counseled on home exercise therapy and supportive  care. - If no improvement consider physical therapy.

## 2018-10-08 DIAGNOSIS — M79671 Pain in right foot: Secondary | ICD-10-CM | POA: Insufficient documentation

## 2018-10-08 NOTE — Assessment & Plan Note (Signed)
Pain likely result of his flat her feet.  This could cause an alignment issue.  His right foot seems to be worse than his left foot. -Pennsaid. -Can consider making custom orthotics and he can follow-up to have these made. -Counseled on home exercise therapy and supportive care. - If no improvement consider physical therapy.

## 2018-10-28 ENCOUNTER — Telehealth: Payer: Self-pay | Admitting: Emergency Medicine

## 2018-10-28 NOTE — Telephone Encounter (Signed)
PA completed for pennsaid via CoverMyMeds.   Key [id number]

## 2018-10-30 NOTE — Telephone Encounter (Signed)
PA was denied

## 2018-10-31 ENCOUNTER — Ambulatory Visit (INDEPENDENT_AMBULATORY_CARE_PROVIDER_SITE_OTHER): Payer: BLUE CROSS/BLUE SHIELD | Admitting: Family Medicine

## 2018-10-31 ENCOUNTER — Ambulatory Visit: Payer: Self-pay | Admitting: *Deleted

## 2018-10-31 ENCOUNTER — Encounter: Payer: Self-pay | Admitting: Family Medicine

## 2018-10-31 DIAGNOSIS — J069 Acute upper respiratory infection, unspecified: Secondary | ICD-10-CM

## 2018-10-31 MED ORDER — ALBUTEROL SULFATE HFA 108 (90 BASE) MCG/ACT IN AERS: 2.0000 | INHALATION_SPRAY | Freq: Four times a day (QID) | RESPIRATORY_TRACT | 0 refills | Status: DC | PRN

## 2018-10-31 NOTE — Assessment & Plan Note (Signed)
Albuterol as needed for wheezing.  Symptomatic therapy suggested: push fluids, rest, use vaporizer or mist prn and return office visit prn if symptoms persist or worsen. Lack of antibiotic effectiveness discussed with him. Call or return to clinic prn if these symptoms worsen or fail to improve as anticipated.

## 2018-10-31 NOTE — Telephone Encounter (Signed)
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     Reason for Disposition . [1] MILD difficulty breathing (e.g., minimal/no SOB at rest, SOB with walking, pulse <100) AND [2] NEW-onset or WORSE than normal  Answer Assessment - Initial Assessment Questions 1. RESPIRATORY STATUS: "Describe your breathing?" (e.g., wheezing, shortness of breath, unable to speak, severe coughing)      Shortness of breath 2. ONSET: "When did this breathing problem begin?"      10/28/28 3. PATTERN "Does the difficult breathing come and go, or has it been constant since it started?"      Comes and goes  4. SEVERITY: "How bad is your breathing?" (e.g., mild, moderate, severe)    - MILD: No SOB at rest, mild SOB with walking, speaks normally in sentences, can lay down, no retractions, pulse < 100.    - MODERATE: SOB at rest, SOB with minimal exertion and prefers to sit, cannot lie down flat, speaks in phrases, mild retractions, audible wheezing, pulse 100-120.    - SEVERE: Very SOB at rest, speaks in single words, struggling to breathe, sitting hunched forward, retractions, pulse > 120      mild 5. RECURRENT SYMPTOM: "Have you had difficulty breathing before?" If so, ask: "When was the last time?" and "What happened that time?"      Yes history of asthma as a child 6. CARDIAC HISTORY: "Do you have any history of heart disease?" (e.g., heart attack, angina, bypass surgery, angioplasty)      no 7. LUNG HISTORY: "Do you have any history of lung disease?"  (e.g., pulmonary  embolus, asthma, emphysema)     History of asthma as a child 8. CAUSE: "What do you think is causing the breathing problem?"      unknown  9. OTHER SYMPTOMS: "Do you have any other symptoms? (e.g., dizziness, runny nose, cough, chest pain, fever)     "Sniffles" 10. PREGNANCY: "Is there any chance you are pregnant?" "When was your last menstrual period?"       n/a 11. TRAVEL: "Have you traveled out of the country in the last month?" (e.g., travel history, exposures)       no  Protocols used: BREATHING DIFFICULTY-A-AH

## 2018-10-31 NOTE — Progress Notes (Signed)
James Klein - 32 y.o. male MRN [id number]  Date of birth: 1986-05-04  Subjective Chief Complaint  Patient presents with  . Sinusitis    ongoing five days-Admits to SOB and tightness. Denies cough. Admtis to fatigue. His son has been sick.  He took alka seltzer this morning with no improvement.    HPI James Klein is a 32 y.o. male here today with complaint of congestion, mild sob, fatigue and occasional wheezing.  He reports symptoms began 4 days ago.  He denies cough, fever, chills, nausea, vomiting, diarrhea, headache or sinus pain.  Child has been sick with similar symptoms.  He has tried alka seltzer cold medication with mild improvement.   ROS:  A comprehensive ROS was completed and negative except as noted per HPI  No Known Allergies  Past Medical History:  Diagnosis Date  . Asthma     History reviewed. No pertinent surgical history.  Social History   Socioeconomic History  . Marital status: Single    Spouse name: Not on file  . Number of children: Not on file  . Years of education: Not on file  . Highest education level: Not on file  Occupational History  . Not on file  Social Needs  . Financial resource strain: Not on file  . Food insecurity:    Worry: Not on file    Inability: Not on file  . Transportation needs:    Medical: Not on file    Non-medical: Not on file  Tobacco Use  . Smoking status: Never Smoker  . Smokeless tobacco: Never Used  Substance and Sexual Activity  . Alcohol use: Yes    Comment: occass.   . Drug use: No  . Sexual activity: Not on file  Lifestyle  . Physical activity:    Days per week: Not on file    Minutes per session: Not on file  . Stress: Not on file  Relationships  . Social connections:    Talks on phone: Not on file    Gets together: Not on file    Attends religious service: Not on file    Active member of club or organization: Not on file    Attends meetings of clubs or organizations: Not on file   Relationship status: Not on file  Other Topics Concern  . Not on file  Social History Narrative  . Not on file    History reviewed. No pertinent family history.  Health Maintenance  Topic Date Due  . TETANUS/TDAP  12/22/2006  . INFLUENZA VACCINE  09/04/2019 (Originally 06/20/2018)  . HIV Screening  09/10/2019 (Originally 12/22/2002)    ----------------------------------------------------------------------------------------------------------------------------------------------------------------------------------------------------------------- Physical Exam BP 138/88   Pulse 62   Temp 98.2 F (36.8 C) (Oral)   Ht 6' 2.5" (1.892 m)   Wt (!) 321 lb (145.6 kg)   SpO2 98%   BMI 40.66 kg/m   Physical Exam Constitutional:      General: He is not in acute distress.    Appearance: Normal appearance.  HENT:     Head: Normocephalic and atraumatic.     Right Ear: Tympanic membrane and external ear normal.     Left Ear: Tympanic membrane and external ear normal.     Nose: Nose normal.     Mouth/Throat:     Mouth: Mucous membranes are moist.     Pharynx: No oropharyngeal exudate or posterior oropharyngeal erythema.  Eyes:     General: No scleral icterus. Neck:     Musculoskeletal: Neck supple.  Cardiovascular:     Rate and Rhythm: Normal rate and regular rhythm.  Pulmonary:     Effort: Pulmonary effort is normal.     Breath sounds: Normal breath sounds.  Lymphadenopathy:     Cervical: No cervical adenopathy.  Skin:    General: Skin is warm and dry.     Findings: No rash.  Neurological:     General: No focal deficit present.     Mental Status: He is alert.  Psychiatric:        Mood and Affect: Mood normal.        Behavior: Behavior normal.     ------------------------------------------------------------------------------------------------------------------------------------------------------------------------------------------------------------------- Assessment and  Plan  URI (upper respiratory infection) Albuterol as needed for wheezing.  Symptomatic therapy suggested: push fluids, rest, use vaporizer or mist prn and return office visit prn if symptoms persist or worsen. Lack of antibiotic effectiveness discussed with him. Call or return to clinic prn if these symptoms worsen or fail to improve as anticipated.

## 2018-10-31 NOTE — Patient Instructions (Signed)
Upper Respiratory Infection, Adult Most upper respiratory infections (URIs) are caused by a virus. A URI affects the nose, throat, and upper air passages. The most common type of URI is often called "the common cold." Follow these instructions at home:  Take medicines only as told by your doctor.  Gargle warm saltwater or take cough drops to comfort your throat as told by your doctor.  Use a warm mist humidifier or inhale steam from a shower to increase air moisture. This may make it easier to breathe.  Drink enough fluid to keep your pee (urine) clear or pale yellow.  Eat soups and other clear broths.  Have a healthy diet.  Rest as needed.  Go back to work when your fever is gone or your doctor says it is okay. ? You may need to stay home longer to avoid giving your URI to others. ? You can also wear a face mask and wash your hands often to prevent spread of the virus.  Use your inhaler more if you have asthma.  Do not use any tobacco products, including cigarettes, chewing tobacco, or electronic cigarettes. If you need help quitting, ask your doctor. Contact a doctor if:  You are getting worse, not better.  Your symptoms are not helped by medicine.  You have chills.  You are getting more short of breath.  You have brown or red mucus.  You have yellow or brown discharge from your nose.  You have pain in your face, especially when you bend forward.  You have a fever.  You have puffy (swollen) neck glands.  You have pain while swallowing.  You have white areas in the back of your throat. Get help right away if:  You have very bad or constant: ? Headache. ? Ear pain. ? Pain in your forehead, behind your eyes, and over your cheekbones (sinus pain). ? Chest pain.  You have long-lasting (chronic) lung disease and any of the following: ? Wheezing. ? Long-lasting cough. ? Coughing up blood. ? A change in your usual mucus.  You have a stiff neck.  You have  changes in your: ? Vision. ? Hearing. ? Thinking. ? Mood. This information is not intended to replace advice given to you by your health care provider. Make sure you discuss any questions you have with your health care provider. Document Released: 04/24/2008 Document Revised: 07/09/2016 Document Reviewed: 02/11/2014 Elsevier Interactive Patient Education  2018 [employer]  

## 2018-11-11 ENCOUNTER — Ambulatory Visit: Payer: BLUE CROSS/BLUE SHIELD | Admitting: Family Medicine

## 2018-11-14 ENCOUNTER — Ambulatory Visit: Payer: BLUE CROSS/BLUE SHIELD

## 2019-03-03 ENCOUNTER — Ambulatory Visit (INDEPENDENT_AMBULATORY_CARE_PROVIDER_SITE_OTHER): Payer: BLUE CROSS/BLUE SHIELD | Admitting: Family Medicine

## 2019-03-03 ENCOUNTER — Encounter: Payer: Self-pay | Admitting: Family Medicine

## 2019-03-03 DIAGNOSIS — J3089 Other allergic rhinitis: Secondary | ICD-10-CM | POA: Diagnosis not present

## 2019-03-03 DIAGNOSIS — J309 Allergic rhinitis, unspecified: Secondary | ICD-10-CM | POA: Insufficient documentation

## 2019-03-03 MED ORDER — FLUTICASONE PROPIONATE 50 MCG/ACT NA SUSP: 2.0000 | Freq: Every day | NASAL | 6 refills | Status: AC

## 2019-03-03 NOTE — Assessment & Plan Note (Signed)
-  Symptoms consistent with allergic rhinitis -Continue cetirizine -Add flonase daily -call or f/u for worsening symptoms.

## 2019-03-03 NOTE — Progress Notes (Signed)
James Klein - 33 y.o. male MRN [id number]  Date of birth: 03-Aug-1986   This visit type was conducted due to national recommendations for restrictions regarding the COVID-19 Pandemic (e.g. social distancing).  This format is felt to be most appropriate for this patient at this time.  All issues noted in this document were discussed and addressed.  No physical exam was performed (except for noted visual exam findings with Video Visits).  I discussed the limitations of evaluation and management by telemedicine and the availability of in person appointments. The patient expressed understanding and agreed to proceed.  I connected with@ on 03/03/19 at  2:15 PM EDT by a video enabled telemedicine application and verified that I am speaking with the correct person using two identifiers.   Patient Location: Home [address] [place] [address]   Provider location:   [facility]  Chief Complaint  Patient presents with  . Nasal Congestion    onset 3 days, runny, no tastebuds,     HPI  James Klein is a 33 y.o. male who presents via Web designer for a telehealth visit today.  He has complaint of nasal congestion and diminished taste.  He reports onset of symptoms about 3 days ago.  He has had scratchy throat as well.  He denies fever, chills, body aches, cough, shortness of breath, sinus pain or headache.  He has tried some zyrtec with improvement of symptoms.    ROS:  A comprehensive ROS was completed and negative except as noted per HPI  Past Medical History:  Diagnosis Date  . Asthma     No past surgical history on file.  No family history on file.  Social History   Socioeconomic History  . Marital status: Single    Spouse name: Not on file  . Number of children: Not on file  . Years of education: Not on file  . Highest education level: Not on file  Occupational History  . Not on file  Social Needs  . Financial resource strain: Not on file   . Food insecurity:    Worry: Not on file    Inability: Not on file  . Transportation needs:    Medical: Not on file    Non-medical: Not on file  Tobacco Use  . Smoking status: Never Smoker  . Smokeless tobacco: Never Used  Substance and Sexual Activity  . Alcohol use: Yes    Comment: occass.   . Drug use: No  . Sexual activity: Not on file  Lifestyle  . Physical activity:    Days per week: Not on file    Minutes per session: Not on file  . Stress: Not on file  Relationships  . Social connections:    Talks on phone: Not on file    Gets together: Not on file    Attends religious service: Not on file    Active member of club or organization: Not on file    Attends meetings of clubs or organizations: Not on file    Relationship status: Not on file  . Intimate partner violence:    Fear of current or ex partner: Not on file    Emotionally abused: Not on file    Physically abused: Not on file    Forced sexual activity: Not on file  Other Topics Concern  . Not on file  Social History Narrative  . Not on file     Current Outpatient Medications:  .  albuterol (PROVENTIL  HFA;VENTOLIN HFA) 108 (90 Base) MCG/ACT inhaler, Inhale 2 puffs into the lungs every 6 (six) hours as needed for wheezing or shortness of breath., Disp: 1 Inhaler, Rfl: 0 .  Diclofenac Sodium (PENNSAID) 2 % SOLN, Place 1 application onto the skin 2 (two) times daily., Disp: 1 Bottle, Rfl: 3  EXAM:  VITALS per patient if applicable: Wt (!) 322 lb (146.1 kg) Comment: pt stated on ph  BMI 40.79 kg/m   GENERAL: alert, oriented, appears well and in no acute distress  HEENT: atraumatic, conjunttiva clear, no obvious abnormalities on inspection of external nose and ears  NECK: normal movements of the head and neck  LUNGS: on inspection no signs of respiratory distress, breathing rate appears normal, no obvious gross SOB, gasping or wheezing  CV: no obvious cyanosis  MS: moves all visible extremities without  noticeable abnormality  PSYCH/NEURO: pleasant and cooperative, no obvious depression or anxiety, speech and thought processing grossly intact  ASSESSMENT AND PLAN:  Discussed the following assessment and plan:  Allergic rhinitis -Symptoms consistent with allergic rhinitis -Continue cetirizine -Add flonase daily -call or f/u for worsening symptoms.      I discussed the assessment and treatment plan with the patient. The patient was provided an opportunity to ask questions and all were answered. The patient agreed with the plan and demonstrated an understanding of the instructions.   The patient was advised to call back or seek an in-person evaluation if the symptoms worsen or if the condition fails to improve as anticipated.    Everrett Coombeody Ellisyn Icenhower, DO

## 2019-08-21 ENCOUNTER — Other Ambulatory Visit: Payer: Self-pay

## 2019-08-21 DIAGNOSIS — Z20828 Contact with and (suspected) exposure to other viral communicable diseases: Secondary | ICD-10-CM | POA: Diagnosis not present

## 2019-08-21 DIAGNOSIS — Z20822 Contact with and (suspected) exposure to covid-19: Secondary | ICD-10-CM

## 2019-08-22 ENCOUNTER — Telehealth: Payer: Self-pay | Admitting: General Practice

## 2019-08-22 LAB — NOVEL CORONAVIRUS, NAA: SARS-CoV-2, NAA: NOT DETECTED

## 2019-08-22 NOTE — Telephone Encounter (Signed)
Negative COVID results given. Patient results "NOT Detected." Caller expressed understanding. ° °

## 2020-04-05 ENCOUNTER — Emergency Department (HOSPITAL_COMMUNITY): Payer: BC Managed Care – PPO

## 2020-04-05 ENCOUNTER — Encounter (HOSPITAL_COMMUNITY): Payer: Self-pay

## 2020-04-05 ENCOUNTER — Emergency Department (HOSPITAL_COMMUNITY)
Admission: EM | Admit: 2020-04-05 | Discharge: 2020-04-05 | Disposition: A | Discharge status: Home / Self Care | Payer: BC Managed Care – PPO | Attending: Emergency Medicine | Admitting: Emergency Medicine

## 2020-04-05 ENCOUNTER — Other Ambulatory Visit: Payer: Self-pay

## 2020-04-05 DIAGNOSIS — M25561 Pain in right knee: Secondary | ICD-10-CM | POA: Insufficient documentation

## 2020-04-05 DIAGNOSIS — J45909 Unspecified asthma, uncomplicated: Secondary | ICD-10-CM | POA: Insufficient documentation

## 2020-04-05 DIAGNOSIS — Z79899 Other long term (current) drug therapy: Secondary | ICD-10-CM | POA: Diagnosis not present

## 2020-04-05 MED ORDER — IBUPROFEN 600 MG PO TABS: 600.0000 mg | ORAL_TABLET | Freq: Four times a day (QID) | ORAL | 0 refills | Status: AC | PRN

## 2020-04-05 NOTE — Discharge Instructions (Addendum)
Buy yourself a compressive sleeve for your knee over the counter at a pharmacy.  You can use this at work for extra support.

## 2020-04-05 NOTE — ED Notes (Signed)
Topaz signature pad unavailable to hallway beds. Patient verbalized understanding of discharge paperwork.

## 2020-04-05 NOTE — ED Provider Notes (Signed)
[facility] DEPT Provider Note   CSN: [id number] Arrival date & time: 04/05/20  1909     History Chief Complaint  Patient presents with  . Knee Pain    [facility] Klein is a 34 y.o. male presented emergency department right knee pain.  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  He thinks he lifted a box wrong way.  He began having pain on the medial aspect of his right knee.  It is a sharp pain.  She has had similar pain in the past when the weather changes.  Has had a sports injury from long ago.  He was able to bear weight afterwards and today.   He says he has an orthopedist office appointment on Friday but wanted to come to the ER to make sure that he was okay  HPI     Past Medical History:  Diagnosis Date  . Asthma     Patient Active Problem List   Diagnosis Date Noted  . Allergic rhinitis 03/03/2019  . URI (upper respiratory infection) 10/31/2018  . Right foot pain 10/08/2018  . Well adult exam 09/09/2018  . BRBPR (bright red blood per rectum) 08/26/2018  . Constipation 08/26/2018    History reviewed. No pertinent surgical history.     No family history on file.  Social History   Tobacco Use  . Smoking status: Never Smoker  . Smokeless tobacco: Never Used  Substance Use Topics  . Alcohol use: Yes    Comment: occass.   . Drug use: No    Home Medications Prior to Admission medications   Medication Sig Start Date End Date Taking? Authorizing Provider  albuterol (PROVENTIL HFA;VENTOLIN HFA) 108 (90 Base) MCG/ACT inhaler Inhale 2 puffs into the lungs every 6 (six) hours as needed for wheezing or shortness of breath. 10/31/18   Luetta Nutting, DO  Diclofenac Sodium (PENNSAID) 2 % SOLN Place 1 application onto the skin 2 (two) times daily. 10/07/18   Rosemarie Ax, MD  fluticasone (FLONASE) 50 MCG/ACT nasal spray Place 2 sprays into both nostrils daily. 03/03/19   Luetta Nutting, DO    ibuprofen (ADVIL) 600 MG tablet Take 1 tablet (600 mg total) by mouth every 6 (six) hours as needed for up to 30 doses for mild pain or moderate pain. 04/05/20   Wyvonnia Dusky, MD    Allergies    Patient has no known allergies.  Review of Systems   Review of Systems  Constitutional: Negative for chills and fever.  Eyes: Negative for photophobia and visual disturbance.  Respiratory: Negative for cough and shortness of breath.   Cardiovascular: Negative for chest pain and palpitations.  Gastrointestinal: Negative for abdominal pain and vomiting.  Musculoskeletal: Positive for arthralgias, gait problem and myalgias.  Skin: Negative for color change and rash.  Allergic/Immunologic: Negative for food allergies and immunocompromised state.  Neurological: Negative for weakness and numbness.  Psychiatric/Behavioral: Negative for agitation and confusion.  All other systems reviewed and are negative.   Physical Exam Updated Vital Signs BP (!) 155/95 (BP Location: Right Arm)   Pulse 74   Temp 97.8 F (36.6 C) (Oral)   Resp 16   Ht 6\' 2"  (1.88 m)   Wt (!) 145.2 kg   SpO2 99%   BMI 41.09 kg/m   Physical Exam Vitals and nursing note reviewed.  Constitutional:      Appearance: He is well-developed.  HENT:     Head: Normocephalic  and atraumatic.  Eyes:     Conjunctiva/sclera: Conjunctivae normal.  Cardiovascular:     Rate and Rhythm: Normal rate and regular rhythm.     Pulses: Normal pulses.  Pulmonary:     Effort: Pulmonary effort is normal. No respiratory distress.     Breath sounds: Normal breath sounds.  Abdominal:     Palpations: Abdomen is soft.     Tenderness: There is no abdominal tenderness.  Musculoskeletal:     Cervical back: Neck supple.     Comments: No significant edema or effusion of the right knee or lower extremity No isolated tenderness of the patella No isolated tenderness of the fibular head Patient able to flex knee to 90 degrees Patient able to bear  weight immediately after incident and here in the ED. No evidence of posterior knee dislocation. Distal extremity is neurovascularly intact.   Skin:    General: Skin is warm and dry.  Neurological:     Mental Status: He is alert.     ED Results / Procedures / Treatments   Labs (all labs ordered are listed, but only abnormal results are displayed) Labs Reviewed - No data to display  EKG None  Radiology DG Knee Complete 4 Views Right  Result Date: 04/05/2020 CLINICAL DATA:  Right knee pain.  No known injury. EXAM: RIGHT KNEE - COMPLETE 4+ VIEW COMPARISON:  None. FINDINGS: Normal anatomic alignment. No evidence for acute fracture or dislocation. Soft tissues unremarkable. IMPRESSION: No acute osseous abnormality. Electronically Signed   By: Annia Belt M.D.   On: 04/05/2020 20:12    Procedures Procedures (including critical care time)  Medications Ordered in ED Medications - No data to display  ED Course  I have reviewed the triage vital signs and the nursing notes.  Pertinent labs & imaging results that were available during my care of the patient were reviewed by me and considered in my medical decision making (see chart for details).  34 yo male presenting with acute on chronic right knee pain, exacerbated at work yesterday.  He has full ROM at the knee, doubtful of tendon rupture. No evidence of fracture on xrays ordered from intake No significant effusion to suggest large hemarthrosis. Doubtful of septic joint.  Possible meniscus injury as his pain is localized on the medial aspect of his knee.  I advised a compression sleeve for stability, he can bear weight as tolerated, take motrin for pain.  He has an orthopedic appointment on Friday of this week.  I can advise light duties at work for the next several days to reduce continued overuse and stress on this joint.   Final Clinical Impression(s) / ED Diagnoses Final diagnoses:  Acute pain of right knee    Rx / DC  Orders ED Discharge Orders         Ordered    ibuprofen (ADVIL) 600 MG tablet  Every 6 hours PRN     04/05/20 2037           Terald Sleeper, MD 04/06/20 1004

## 2020-04-05 NOTE — ED Triage Notes (Signed)
Pt c/o right knee pain. No known injury. Sts wrong movement. Wants to make sure it is ok for vacation next week.

## 2020-04-09 ENCOUNTER — Encounter: Payer: Self-pay | Admitting: Family Medicine

## 2020-04-09 ENCOUNTER — Other Ambulatory Visit: Payer: Self-pay

## 2020-04-09 ENCOUNTER — Ambulatory Visit (INDEPENDENT_AMBULATORY_CARE_PROVIDER_SITE_OTHER): Payer: BC Managed Care – PPO | Admitting: Family Medicine

## 2020-04-09 VITALS — BP 122/80 | HR 69 | Temp 97.5°F | Ht 74.0 in | Wt 328.0 lb

## 2020-04-09 DIAGNOSIS — S8391XD Sprain of unspecified site of right knee, subsequent encounter: Secondary | ICD-10-CM | POA: Diagnosis not present

## 2020-04-09 DIAGNOSIS — M25561 Pain in right knee: Secondary | ICD-10-CM | POA: Diagnosis not present

## 2020-04-09 NOTE — Progress Notes (Signed)
James Klein is a 34 y.o. male  Chief Complaint  Patient presents with  . Knee Pain    Patient is here today C/O pain from an old knee injury.Pt went to ER Monday night-x-rayed no broke, fracture, no fluid    HPI: [facility] Klein is a 34 y.o. male who complains of Rt medial knee pain that began while at work (works at [employer]) about 5 days ago. He thinks he may have lifted a box the wrong way and then began having sharp pain in his knee. He was seen the following day 04/05/20 at [facility] ER. Xray was negative for any acute process (ER notes including xray report reviewed by me today), exam was reassuring, and pt was discharged. Pt does endorse a remote knee injury - patella fracture - between 8th and 9th grade. He has been wearing a knee brace this week. Overall pain is improving. He is able to walk, bear weight. Knee does not feel unstable. No swelling.  He has been applying ice. He has not taken any meds.    Past Medical History:  Diagnosis Date  . Asthma     History reviewed. No pertinent surgical history.  Social History   Socioeconomic History  . Marital status: Single    Spouse name: Not on file  . Number of children: Not on file  . Years of education: Not on file  . Highest education level: Not on file  Occupational History  . Not on file  Tobacco Use  . Smoking status: Never Smoker  . Smokeless tobacco: Never Used  Substance and Sexual Activity  . Alcohol use: Yes    Comment: occass.   . Drug use: No  . Sexual activity: Not on file  Other Topics Concern  . Not on file  Social History Narrative  . Not on file   Social Determinants of Health   Financial Resource Strain:   . Difficulty of Paying Living Expenses:   Food Insecurity:   . Worried About Charity fundraiser in the Last Year:   . Arboriculturist in the Last Year:   Transportation Needs:   . Film/video editor (Medical):   Marland Kitchen Lack of Transportation (Non-Medical):   Physical Activity:   .  Days of Exercise per Week:   . Minutes of Exercise per Session:   Stress:   . Feeling of Stress :   Social Connections:   . Frequency of Communication with Friends and Family:   . Frequency of Social Gatherings with Friends and Family:   . Attends Religious Services:   . Active Member of Clubs or Organizations:   . Attends Archivist Meetings:   Marland Kitchen Marital Status:   Intimate Partner Violence:   . Fear of Current or Ex-Partner:   . Emotionally Abused:   Marland Kitchen Physically Abused:   . Sexually Abused:     History reviewed. No pertinent family history.    There is no immunization history on file for this patient.  Outpatient Encounter Medications as of 04/09/2020  Medication Sig  . Diclofenac Sodium (PENNSAID) 2 % SOLN Place 1 application onto the skin 2 (two) times daily.  . fluticasone (FLONASE) 50 MCG/ACT nasal spray Place 2 sprays into both nostrils daily.  Marland Kitchen ibuprofen (ADVIL) 600 MG tablet Take 1 tablet (600 mg total) by mouth every 6 (six) hours as needed for up to 30 doses for mild pain or moderate pain.  Marland Kitchen albuterol (PROVENTIL HFA;VENTOLIN HFA) 108 (90  Base) MCG/ACT inhaler Inhale 2 puffs into the lungs every 6 (six) hours as needed for wheezing or shortness of breath. (Patient not taking: Reported on 04/09/2020)   No facility-administered encounter medications on file as of 04/09/2020.     ROS: Pertinent positives and negatives noted in HPI. Remainder of ROS non-contributory    No Known Allergies  BP 122/80 (BP Location: Left Arm, Patient Position: Sitting, Cuff Size: Large)   Pulse 69   Temp (!) 97.5 F (36.4 C) (Tympanic)   Ht 6\' 2"  (1.88 m)   Wt (!) 328 lb (148.8 kg)   SpO2 96%   BMI 42.11 kg/m   Physical Exam  Constitutional: He is oriented to person, place, and time. He appears well-developed and well-nourished. No distress.  Cardiovascular: Normal pulses.  Musculoskeletal:     Right knee: No swelling, deformity, effusion, erythema or ecchymosis.  Normal range of motion. No tenderness. No LCL laxity or MCL laxity.  Neurological: He is alert and oriented to person, place, and time. No sensory deficit.  Psychiatric: He has a normal mood and affect. His behavior is normal.    CLINICAL DATA:  Right knee pain.  No known injury.  EXAM: RIGHT KNEE - COMPLETE 4+ VIEW  COMPARISON:  None.  FINDINGS: Normal anatomic alignment. No evidence for acute fracture or dislocation. Soft tissues unremarkable.  IMPRESSION: No acute osseous abnormality.   Electronically Signed   By: M.D.   On: 04/05/2020 20:12   A/P:  1. Right medial knee pain 2. Sprain of right knee, unspecified ligament, subsequent encounter - improving - cont with ice, heat, exercises, wear brace when at work - note for work given - f/u PRN  This visit occurred during the 04/07/2020 health emergency.  Safety protocols were in place, including screening questions prior to the visit, additional usage of staff PPE, and extensive cleaning of exam room while observing appropriate contact time as indicated for disinfecting solutions.

## 2020-04-09 NOTE — Patient Instructions (Addendum)
Continue with ice 10-15 min then heat 15-20 min Knee exercises - see below - daily Take ibuprofen 600mg  twice per day with food x 3-5 days     Knee Exercises Ask your health care provider which exercises are safe for you. Do exercises exactly as told by your health care provider and adjust them as directed. It is normal to feel mild stretching, pulling, tightness, or discomfort as you do these exercises. Stop right away if you feel sudden pain or your pain gets worse. Do not begin these exercises until told by your health care provider. Stretching and range-of-motion exercises These exercises warm up your muscles and joints and improve the movement and flexibility of your knee. These exercises also help to relieve pain and swelling. Knee extension, prone 1. Lie on your abdomen (prone position) on a bed. 2. Place your left / right knee just beyond the edge of the surface so your knee is not on the bed. You can put a towel under your left / right thigh just above your kneecap for comfort. 3. Relax your leg muscles and allow gravity to straighten your knee (extension). You should feel a stretch behind your left / right knee. 4. Hold this position for __________ seconds. 5. Scoot up so your knee is supported between repetitions. Repeat __________ times. Complete this exercise __________ times a day. Knee flexion, active  1. Lie on your back with both legs straight. If this causes back discomfort, bend your left / right knee so your foot is flat on the floor. 2. Slowly slide your left / right heel back toward your buttocks. Stop when you feel a gentle stretch in the front of your knee or thigh (flexion). 3. Hold this position for __________ seconds. 4. Slowly slide your left / right heel back to the starting position. Repeat __________ times. Complete this exercise __________ times a day. Quadriceps stretch, prone  1. Lie on your abdomen on a firm surface, such as a bed or padded  floor. 2. Bend your left / right knee and hold your ankle. If you cannot reach your ankle or pant leg, loop a belt around your foot and grab the belt instead. 3. Gently pull your heel toward your buttocks. Your knee should not slide out to the side. You should feel a stretch in the front of your thigh and knee (quadriceps). 4. Hold this position for __________ seconds. Repeat __________ times. Complete this exercise __________ times a day. Hamstring, supine 1. Lie on your back (supine position). 2. Loop a belt or towel over the ball of your left / right foot. The ball of your foot is on the walking surface, right under your toes. 3. Straighten your left / right knee and slowly pull on the belt to raise your leg until you feel a gentle stretch behind your knee (hamstring). ? Do not let your knee bend while you do this. ? Keep your other leg flat on the floor. 4. Hold this position for __________ seconds. Repeat __________ times. Complete this exercise __________ times a day. Strengthening exercises These exercises build strength and endurance in your knee. Endurance is the ability to use your muscles for a long time, even after they get tired. Quadriceps, isometric This exercise stretches the muscles in front of your thigh (quadriceps) without moving your knee joint (isometric). 1. Lie on your back with your left / right leg extended and your other knee bent. Put a rolled towel or small pillow under your knee if  told by your health care provider. 2. Slowly tense the muscles in the front of your left / right thigh. You should see your kneecap slide up toward your hip or see increased dimpling just above the knee. This motion will push the back of the knee toward the floor. 3. For __________ seconds, hold the muscle as tight as you can without increasing your pain. 4. Relax the muscles slowly and completely. Repeat __________ times. Complete this exercise __________ times a day. Straight leg  raises This exercise stretches the muscles in front of your thigh (quadriceps) and the muscles that move your hips (hip flexors). 1. Lie on your back with your left / right leg extended and your other knee bent. 2. Tense the muscles in the front of your left / right thigh. You should see your kneecap slide up or see increased dimpling just above the knee. Your thigh may even shake a bit. 3. Keep these muscles tight as you raise your leg 4-6 inches (10-15 cm) off the floor. Do not let your knee bend. 4. Hold this position for __________ seconds. 5. Keep these muscles tense as you lower your leg. 6. Relax your muscles slowly and completely after each repetition. Repeat __________ times. Complete this exercise __________ times a day. Hamstring, isometric 1. Lie on your back on a firm surface. 2. Bend your left / right knee about __________ degrees. 3. Dig your left / right heel into the surface as if you are trying to pull it toward your buttocks. Tighten the muscles in the back of your thighs (hamstring) to "dig" as hard as you can without increasing any pain. 4. Hold this position for __________ seconds. 5. Release the tension gradually and allow your muscles to relax completely for __________ seconds after each repetition. Repeat __________ times. Complete this exercise __________ times a day. Hamstring curls If told by your health care provider, do this exercise while wearing ankle weights. Begin with __________ lb weights. Then increase the weight by 1 lb (0.5 kg) increments. Do not wear ankle weights that are more than __________ lb. 1. Lie on your abdomen with your legs straight. 2. Bend your left / right knee as far as you can without feeling pain. Keep your hips flat against the floor. 3. Hold this position for __________ seconds. 4. Slowly lower your leg to the starting position. Repeat __________ times. Complete this exercise __________ times a day. Squats This exercise strengthens  the muscles in front of your thigh and knee (quadriceps). 1. Stand in front of a table, with your feet and knees pointing straight ahead. You may rest your hands on the table for balance but not for support. 2. Slowly bend your knees and lower your hips like you are going to sit in a chair. ? Keep your weight over your heels, not over your toes. ? Keep your lower legs upright so they are parallel with the table legs. ? Do not let your hips go lower than your knees. ? Do not bend lower than told by your health care provider. ? If your knee pain increases, do not bend as low. 3. Hold the squat position for __________ seconds. 4. Slowly push with your legs to return to standing. Do not use your hands to pull yourself to standing. Repeat __________ times. Complete this exercise __________ times a day. Wall slides This exercise strengthens the muscles in front of your thigh and knee (quadriceps). 1. Lean your back against a smooth wall or door,  and walk your feet out 18-24 inches (46-61 cm) from it. 2. Place your feet hip-width apart. 3. Slowly slide down the wall or door until your knees bend __________ degrees. Keep your knees over your heels, not over your toes. Keep your knees in line with your hips. 4. Hold this position for __________ seconds. Repeat __________ times. Complete this exercise __________ times a day. Straight leg raises This exercise strengthens the muscles that rotate the leg at the hip and move it away from your body (hip abductors). 1. Lie on your side with your left / right leg in the top position. Lie so your head, shoulder, knee, and hip line up. You may bend your bottom knee to help you keep your balance. 2. Roll your hips slightly forward so your hips are stacked directly over each other and your left / right knee is facing forward. 3. Leading with your heel, lift your top leg 4-6 inches (10-15 cm). You should feel the muscles in your outer hip lifting. ? Do not let your  foot drift forward. ? Do not let your knee roll toward the ceiling. 4. Hold this position for __________ seconds. 5. Slowly return your leg to the starting position. 6. Let your muscles relax completely after each repetition. Repeat __________ times. Complete this exercise __________ times a day. Straight leg raises This exercise stretches the muscles that move your hips away from the front of the pelvis (hip extensors). 1. Lie on your abdomen on a firm surface. You can put a pillow under your hips if that is more comfortable. 2. Tense the muscles in your buttocks and lift your left / right leg about 4-6 inches (10-15 cm). Keep your knee straight as you lift your leg. 3. Hold this position for __________ seconds. 4. Slowly lower your leg to the starting position. 5. Let your leg relax completely after each repetition. Repeat __________ times. Complete this exercise __________ times a day. This information is not intended to replace advice given to you by your health care provider. Make sure you discuss any questions you have with your health care provider. Document Revised: 08/27/2018 Document Reviewed: 08/27/2018 Elsevier Patient Education  2020 [employer].

## 2020-04-12 ENCOUNTER — Telehealth: Payer: Self-pay | Admitting: Family Medicine

## 2020-04-12 NOTE — Telephone Encounter (Signed)
Letter rewritten and printed off.  Pt aware.

## 2020-04-12 NOTE — Telephone Encounter (Signed)
Patient came in the office requesting a letter to return to work with no restrictions starting 5/25. Patient stated that he is no longer experiencing knee pain and is ready to go back to work.  Informed patient that Dr. Barron Alvine wasn't in the office this week and once a clinical team can review his request if a note can be provided then they will call him when the letter is ready for pick up. CB is [phone]

## 2020-04-12 NOTE — Telephone Encounter (Signed)
Ok to provide letter

## 2020-04-12 NOTE — Telephone Encounter (Signed)
Patient saw Dr. Salena Saner on Friday and she put him on light duty but his knee feels fine today. He is requesting letter to release him back to work today.

## 2020-04-12 NOTE — Telephone Encounter (Signed)
Please see message and advise.  Thank you. ° °

## 2020-04-23 NOTE — Telephone Encounter (Signed)
Patient called and stated he had not picked up letter yet and requested I fax it to his job. He provided fax #[phone] and I faxed it. Letter and fax confirmation are in envelope with letter for him to pick up.

## 2020-05-11 ENCOUNTER — Ambulatory Visit: Payer: BC Managed Care – PPO | Admitting: Nurse Practitioner

## 2020-05-20 ENCOUNTER — Telehealth: Payer: Self-pay | Admitting: Nurse Practitioner

## 2020-05-20 ENCOUNTER — Encounter: Payer: Self-pay | Admitting: Nurse Practitioner

## 2020-05-20 NOTE — Telephone Encounter (Signed)
Ok to send letter

## 2020-05-20 NOTE — Telephone Encounter (Signed)
Pt was no show for appt 05/11/2020 acute appt. Pt has not scheduled a TOC appt since Dr. Ashley Royalty left. This is the first occurrence and the fee was waived as a one time courtesy.

## 2020-08-20 ENCOUNTER — Encounter (HOSPITAL_COMMUNITY): Payer: Self-pay | Admitting: Emergency Medicine

## 2020-08-20 ENCOUNTER — Other Ambulatory Visit: Payer: Self-pay

## 2020-08-20 ENCOUNTER — Emergency Department (HOSPITAL_COMMUNITY): Payer: BC Managed Care – PPO

## 2020-08-20 DIAGNOSIS — R0600 Dyspnea, unspecified: Secondary | ICD-10-CM | POA: Diagnosis not present

## 2020-08-20 DIAGNOSIS — U071 COVID-19: Secondary | ICD-10-CM | POA: Insufficient documentation

## 2020-08-20 DIAGNOSIS — R0789 Other chest pain: Secondary | ICD-10-CM | POA: Diagnosis not present

## 2020-08-20 DIAGNOSIS — J45909 Unspecified asthma, uncomplicated: Secondary | ICD-10-CM | POA: Insufficient documentation

## 2020-08-20 DIAGNOSIS — R079 Chest pain, unspecified: Secondary | ICD-10-CM | POA: Diagnosis not present

## 2020-08-20 LAB — TROPONIN I (HIGH SENSITIVITY): Troponin I (High Sensitivity): 6 ng/L (ref <18)

## 2020-08-20 LAB — CBC
HCT: 45.3 % (ref 39.0–52.0)
Hemoglobin: 15.4 g/dL (ref 13.0–17.0)
MCH: 33.2 pg (ref 26.0–34.0)
MCHC: 34 g/dL (ref 30.0–36.0)
MCV: 97.6 fL (ref 80.0–100.0)
Platelets: 215 10*3/uL (ref 150–400)
RBC: 4.64 MIL/uL (ref 4.22–5.81)
RDW: 12.6 % (ref 11.5–15.5)
WBC: 6.1 10*3/uL (ref 4.0–10.5)
nRBC: 0 % (ref 0.0–0.2)

## 2020-08-20 LAB — BASIC METABOLIC PANEL
Anion gap: 7 (ref 5–15)
BUN: 17 mg/dL (ref 6–20)
CO2: 29 mmol/L (ref 22–32)
Calcium: 8.4 mg/dL — ABNORMAL LOW (ref 8.9–10.3)
Chloride: 103 mmol/L (ref 98–111)
Creatinine, Ser: 1.37 mg/dL — ABNORMAL HIGH (ref 0.61–1.24)
GFR calc Af Amer: >60 mL/min (ref >60)
GFR calc non Af Amer: >60 mL/min (ref >60)
Glucose, Bld: 106 mg/dL — ABNORMAL HIGH (ref 70–99)
Potassium: 3.7 mmol/L (ref 3.5–5.1)
Sodium: 139 mmol/L (ref 135–145)

## 2020-08-20 NOTE — ED Triage Notes (Signed)
Patient c/o constant dull central chest pain x2 days. Also reports loss of taste and smell. Denies known exposures. Denies cough and fever.

## 2020-08-21 ENCOUNTER — Encounter (HOSPITAL_COMMUNITY): Payer: Self-pay | Admitting: Student

## 2020-08-21 ENCOUNTER — Emergency Department (HOSPITAL_COMMUNITY)
Admission: EM | Admit: 2020-08-21 | Discharge: 2020-08-21 | Disposition: A | Discharge status: Home / Self Care | Payer: BC Managed Care – PPO | Attending: Emergency Medicine | Admitting: Emergency Medicine

## 2020-08-21 DIAGNOSIS — U071 COVID-19: Secondary | ICD-10-CM | POA: Diagnosis not present

## 2020-08-21 DIAGNOSIS — R0789 Other chest pain: Secondary | ICD-10-CM | POA: Diagnosis not present

## 2020-08-21 DIAGNOSIS — R079 Chest pain, unspecified: Secondary | ICD-10-CM

## 2020-08-21 LAB — RESPIRATORY PANEL BY RT PCR (FLU A&B, COVID)
Influenza A by PCR: NEGATIVE
Influenza B by PCR: NEGATIVE
SARS Coronavirus 2 by RT PCR: POSITIVE — AB

## 2020-08-21 LAB — TROPONIN I (HIGH SENSITIVITY): Troponin I (High Sensitivity): 7 ng/L (ref <18)

## 2020-08-21 MED ORDER — METHOCARBAMOL 500 MG PO TABS: 500.0000 mg | ORAL_TABLET | Freq: Three times a day (TID) | ORAL | 0 refills | Status: AC | PRN

## 2020-08-21 NOTE — ED Provider Notes (Signed)
[facility]-EMERGENCY DEPT Provider Note   CSN: [id number] Arrival date & time: 08/20/20  1922     History Chief Complaint  Patient presents with  . Chest Pain    James Klein is a 34 y.o. male with a history of asthma who presents to the ED with complaints of chest pain x 3-4 days.  Patient states he is having intermittent episodes of sharp chest pain to the central lower chest which he describes as sharp lasting a few seconds.  Pain is worse with heavy lifting and with deep breathing.  No other alleviating or aggravating factors.  No intervention prior to arrival.  He does do a lot of heavy lifting at his job.  No specific traumatic injury.  He also mentions loss of taste/smell that began 1 week ago.  He denies fever, chills, congestion, sore throat, ear pain, cough, dyspnea, wheezing, leg pain/swelling, hemoptysis, recent surgery/trauma, recent long travel, hormone use, personal hx of cancer, or hx of DVT/PE.      HPI     Past Medical History:  Diagnosis Date  . Asthma     Patient Active Problem List   Diagnosis Date Noted  . Allergic rhinitis 03/03/2019  . URI (upper respiratory infection) 10/31/2018  . Right foot pain 10/08/2018  . Well adult exam 09/09/2018  . BRBPR (bright red blood per rectum) 08/26/2018  . Constipation 08/26/2018    History reviewed. No pertinent surgical history.     History reviewed. No pertinent family history.  Social History   Tobacco Use  . Smoking status: Never Smoker  . Smokeless tobacco: Never Used  Vaping Use  . Vaping Use: Never used  Substance Use Topics  . Alcohol use: Yes    Comment: occass.   . Drug use: No    Home Medications Prior to Admission medications   Medication Sig Start Date End Date Taking? Authorizing Provider  albuterol (PROVENTIL HFA;VENTOLIN HFA) 108 (90 Base) MCG/ACT inhaler Inhale 2 puffs into the lungs every 6 (six) hours as needed for wheezing or shortness of  breath. Patient not taking: Reported on 04/09/2020 10/31/18   Everrett Coombe, DO  Diclofenac Sodium (PENNSAID) 2 % SOLN Place 1 application onto the skin 2 (two) times daily. 10/07/18   Myra Rude, MD  fluticasone (FLONASE) 50 MCG/ACT nasal spray Place 2 sprays into both nostrils daily. 03/03/19   Everrett Coombe, DO  ibuprofen (ADVIL) 600 MG tablet Take 1 tablet (600 mg total) by mouth every 6 (six) hours as needed for up to 30 doses for mild pain or moderate pain. 04/05/20   Terald Sleeper, MD    Allergies    Patient has no known allergies.  Review of Systems   Review of Systems  Constitutional: Negative for chills and fever.  HENT: Negative for congestion, ear pain and sore throat.        Positive for loss of taste and smell.  Respiratory: Negative for cough and shortness of breath.   Cardiovascular: Positive for chest pain. Negative for leg swelling.  Gastrointestinal: Negative for abdominal pain, diarrhea, nausea and vomiting.  Neurological: Negative for syncope, weakness and numbness.  All other systems reviewed and are negative.   Physical Exam Updated Vital Signs BP (!) 132/104 (BP Location: Left Arm)   Pulse 63   Temp 98.8 F (37.1 C) (Oral)   Resp 16   Ht 6\' 2"  (1.88 m)   Wt (!) 146.1 kg   SpO2 99%   BMI 41.34 kg/m  Physical Exam Vitals and nursing note reviewed.  Constitutional:      General: He is not in acute distress.    Appearance: He is well-developed. He is not toxic-appearing.  HENT:     Head: Normocephalic and atraumatic.  Eyes:     General:        Right eye: No discharge.        Left eye: No discharge.     Conjunctiva/sclera: Conjunctivae normal.  Cardiovascular:     Rate and Rhythm: Normal rate and regular rhythm.     Heart sounds: No murmur heard.  No friction rub.  Pulmonary:     Effort: Pulmonary effort is normal. No respiratory distress.     Breath sounds: Normal breath sounds. No wheezing, rhonchi or rales.  Chest:     Chest  wall: Tenderness (anterior chest wall) present.  Abdominal:     General: There is no distension.     Palpations: Abdomen is soft.     Tenderness: There is no abdominal tenderness.  Musculoskeletal:     Cervical back: Neck supple.     Right lower leg: No tenderness. No edema.     Left lower leg: No tenderness. No edema.  Skin:    General: Skin is warm and dry.     Findings: No rash.  Neurological:     Mental Status: He is alert.     Comments: Clear speech.   Psychiatric:        Behavior: Behavior normal.     ED Results / Procedures / Treatments   Labs (all labs ordered are listed, but only abnormal results are displayed) Labs Reviewed  BASIC METABOLIC PANEL - Abnormal; Notable for the following components:      Result Value   Glucose, Bld 106 (*)    Creatinine, Ser 1.37 (*)    Calcium 8.4 (*)    All other components within normal limits  RESPIRATORY PANEL BY RT PCR (FLU A&B, COVID)  CBC  TROPONIN I (HIGH SENSITIVITY)  TROPONIN I (HIGH SENSITIVITY)    EKG EKG Interpretation  Date/Time:  Friday August 20 2020 20:02:23 EDT Ventricular Rate:  80 PR Interval:    QRS Duration: 89 QT Interval:  325 QTC Calculation: 375 R Axis:   34 Text Interpretation: Sinus rhythm Anteroseptal infarct, old Borderline T abnormalities, inferior leads Baseline wander in lead(s) V4 V5 12 Lead; Mason-Likar No old tracing to compare Confirmed by Pricilla Loveless [phone]) on 08/21/2020 3:49:59 AM   Radiology DG Chest 2 View  Result Date: 08/20/2020 CLINICAL DATA:  Difficulty breathing x3 days, no CP Hx: Asthma as a child, smoke occasionally EXAM: CHEST - 2 VIEW COMPARISON:  None. FINDINGS: The cardiomediastinal contours are within normal limits. The lungs are clear. No pneumothorax or pleural effusion. No acute finding in the visualized skeleton. IMPRESSION: No active cardiopulmonary disease. Electronically Signed   By: Emmaline Kluver M.D.   On: 08/20/2020 20:16    Procedures Procedures  (including critical care time)  Medications Ordered in ED Medications - No data to display  ED Course  I have reviewed the triage vital signs and the nursing notes.  Pertinent labs & imaging results that were available during my care of the patient were reviewed by me and considered in my medical decision making (see chart for details).    Jeffrie Lofstrom was evaluated in Emergency Department on 08/21/2020 for the symptoms described in the history of present illness. He/she was evaluated in the context of the global COVID-19  pandemic, which necessitated consideration that the patient might be at risk for infection with the SARS-CoV-2 virus that causes COVID-19. Institutional protocols and algorithms that pertain to the evaluation of patients at risk for COVID-19 are in a state of rapid change based on information released by regulatory bodies including the CDC and federal and state organizations. These policies and algorithms were followed during the patient's care in the ED.  MDM Rules/Calculators/A&P                          Patient presents to the ED with complaints of intermittent chest pain over the past few days as well as loss of taste/smell over the past 1 week.  Patient is nontoxic, resting comfortably, his initially elevated blood pressure normalized with repeat vitals, otherwise vitals within normal limits.  DDx: ACS, pulmonary embolism, dissection, pneumonia, pneumothorax, Boerhaave syndrome, GERD, PUD, pericarditis, pleurisy, musculoskeletal pain, anxiety, COVID-19.  Additional history obtained:  Additional history obtained from nursing note review and chart review..  EKG: No STEMI Lab Tests:  I reviewed and interpreted labs, which included:  CBC: No anemia BMP: Elevated creatinine similar to prior ranges on chart review. Troponins: No significant elevation, flat. COVID-19 testing: Positive  Imaging Studies ordered:  Chest x-ray ordered per triage, I independently  visualized and interpreted imaging which showed no acute process.  EKG does not show STEMI, troponins are flat, low risk hear score, doubt ACS.  PERC negative doubt pulmonary embolism.  Chest x-ray reassuring.  No diffuse ST changes on EKG or troponin elevation to raise concern for pericarditis or myocarditis.  Unclear definitive etiology to patient's chest discomfort, however high suspicion for pleurisy versus musculoskeletal pain given pain with deep breaths but also reproducible with chest wall palpation.  His COVID-19 diagnosis may also be playing a factor.  He is not respiratory stress, ambulatory SPO2 is > 95% on RA.  No wheezing or shortness of breath to indicate asthma exacerbation.  He overall appears appropriate for discharge home with supportive care at this time.  We discussed need for isolation. I discussed results, treatment plan, need for follow-up, and return precautions with the patient. Provided opportunity for questions, patient confirmed understanding and is in agreement with plan.    Portions of this note were generated with Scientist, clinical (histocompatibility and immunogenetics). Dictation errors may occur despite best attempts at proofreading.  Final Clinical Impression(s) / ED Diagnoses Final diagnoses:  Chest pain, unspecified type  COVID-19    Rx / DC Orders ED Discharge Orders         Ordered    methocarbamol (ROBAXIN) 500 MG tablet  Every 8 hours PRN        08/21/20 0540           Taneisha Fuson, Pleas Koch, PA-C 08/21/20 2992    Pricilla Loveless, MD 08/21/20 [phone]

## 2020-08-21 NOTE — Discharge Instructions (Signed)
You were seen in the emergency department today for chest pain and loss of taste/smell.  Your COVID-19 test was positive.  Your chest x-ray was normal.  Your EKG and heart enzymes not show signs of heart attack.  Your kidney function is somewhat elevated but this is similar to prior, please have this rechecked by primary care provider.  We are sending you home with Robaxin to help with chest discomfort. - Robaxin is the muscle relaxer I have prescribed, this is meant to help with muscle tightness. Be aware that this medication may make you drowsy therefore the first time you take this it should be at a time you are in an environment where you can rest. Do not drive or operate heavy machinery when taking this medication. Do not drink alcohol or take other sedating medications with this medicine such as narcotics or benzodiazepines.   You make take Tylenol per over the counter dosing with these medications.   We have prescribed you new medication(s) today. Discuss the medications prescribed today with your pharmacist as they can have adverse effects and interactions with your other medicines including over the counter and prescribed medications. Seek medical evaluation if you start to experience new or abnormal symptoms after taking one of these medicines, seek care immediately if you start to experience difficulty breathing, feeling of your throat closing, facial swelling, or rash as these could be indications of a more serious allergic reaction   We are instructing patient's with COVID 19 or symptoms of COVID 19 to isolate themselves for 14 days. You may be able to discontinue self isolate if the following conditions are met:   Persons with COVID-19 who have symptoms and were directed to care for themselves at home may discontinue home isolation under the  following conditions: - It has been at least 7 days have passed since symptoms first appeared. - AND at least 3 days (72 hours) have passed since  recovery defined as resolution of fever without the use of fever-reducing medications and improvement in respiratory symptoms (e.g., cough, shortness of breath)  Please follow the below instructions.   Please follow up with primary care within 3-5 days for re-evaluation- call prior to going to the office to make them aware of your symptoms as some offices are altering their method of seeing patients with COVID 19 symptoms. Return to the ER for new or worsening symptoms including but not limited to increased work of breathing, new/different/worsening chest pain, passing out, or any other concerns.       Person Under Monitoring Name: James Klein  Location: [address] [place] [address]   Infection Prevention Recommendations for Individuals Confirmed to have, or Being Evaluated for, 2019 Novel Coronavirus (COVID-19) Infection Who Receive Care at Home  Individuals who are confirmed to have, or are being evaluated for, COVID-19 should follow the prevention steps below until a healthcare provider or local or state health department says they can return to normal activities.  Stay home except to get medical care You should restrict activities outside your home, except for getting medical care. Do not go to work, school, or public areas, and do not use public transportation or taxis.  Call ahead before visiting your doctor Before your medical appointment, call the healthcare provider and tell them that you have, or are being evaluated for, COVID-19 infection. This will help the healthcare provider's office take steps to keep other people from getting infected. Ask your healthcare provider to call the local  or state health department.  Monitor your symptoms Seek prompt medical attention if your illness is worsening (e.g., difficulty breathing). Before going to your medical appointment, call the healthcare provider and tell them that you have, or are being evaluated for,  COVID-19 infection. Ask your healthcare provider to call the local or state health department.  Wear a facemask You should wear a facemask that covers your nose and mouth when you are in the same room with other people and when you visit a healthcare provider. People who live with or visit you should also wear a facemask while they are in the same room with you.  Separate yourself from other people in your home As much as possible, you should stay in a different room from other people in your home. Also, you should use a separate bathroom, if available.  Avoid sharing household items You should not share dishes, drinking glasses, cups, eating utensils, towels, bedding, or other items with other people in your home. After using these items, you should wash them thoroughly with soap and water.  Cover your coughs and sneezes Cover your mouth and nose with a tissue when you cough or sneeze, or you can cough or sneeze into your sleeve. Throw used tissues in a lined trash can, and immediately wash your hands with soap and water for at least 20 seconds or use an alcohol-based hand rub.  Wash your [facility] your hands often and thoroughly with soap and water for at least 20 seconds. You can use an alcohol-based hand sanitizer if soap and water are not available and if your hands are not visibly dirty. Avoid touching your eyes, nose, and mouth with unwashed hands.   Prevention Steps for Caregivers and Household Members of Individuals Confirmed to have, or Being Evaluated for, COVID-19 Infection Being Cared for in the Home  If you live with, or provide care at home for, a person confirmed to have, or being evaluated for, COVID-19 infection please follow these guidelines to prevent infection:  Follow healthcare provider's instructions Make sure that you understand and can help the patient follow any healthcare provider instructions for all care.  Provide for the patient's basic needs You  should help the patient with basic needs in the home and provide support for getting groceries, prescriptions, and other personal needs.  Monitor the patient's symptoms If they are getting sicker, call his or her medical provider and tell them that the patient has, or is being evaluated for, COVID-19 infection. This will help the healthcare provider's office take steps to keep other people from getting infected. Ask the healthcare provider to call the local or state health department.  Limit the number of people who have contact with the patient If possible, have only one caregiver for the patient. Other household members should stay in another home or place of residence. If this is not possible, they should stay in another room, or be separated from the patient as much as possible. Use a separate bathroom, if available. Restrict visitors who do not have an essential need to be in the home.  Keep older adults, very young children, and other sick people away from the patient Keep older adults, very young children, and those who have compromised immune systems or chronic health conditions away from the patient. This includes people with chronic heart, lung, or kidney conditions, diabetes, and cancer.  Ensure good ventilation Make sure that shared spaces in the home have good air flow, such as from an air  conditioner or an opened window, weather permitting.  Wash your hands often Wash your hands often and thoroughly with soap and water for at least 20 seconds. You can use an alcohol based hand sanitizer if soap and water are not available and if your hands are not visibly dirty. Avoid touching your eyes, nose, and mouth with unwashed hands. Use disposable paper towels to dry your hands. If not available, use dedicated cloth towels and replace them when they become wet.  Wear a facemask and gloves Wear a disposable facemask at all times in the room and gloves when you touch or have contact  with the patient's blood, body fluids, and/or secretions or excretions, such as sweat, saliva, sputum, nasal mucus, vomit, urine, or feces.  Ensure the mask fits over your nose and mouth tightly, and do not touch it during use. Throw out disposable facemasks and gloves after using them. Do not reuse. Wash your hands immediately after removing your facemask and gloves. If your personal clothing becomes contaminated, carefully remove clothing and launder. Wash your hands after handling contaminated clothing. Place all used disposable facemasks, gloves, and other waste in a lined container before disposing them with other household waste. Remove gloves and wash your hands immediately after handling these items.  Do not share dishes, glasses, or other household items with the patient Avoid sharing household items. You should not share dishes, drinking glasses, cups, eating utensils, towels, bedding, or other items with a patient who is confirmed to have, or being evaluated for, COVID-19 infection. After the person uses these items, you should wash them thoroughly with soap and water.  Wash laundry thoroughly Immediately remove and wash clothes or bedding that have blood, body fluids, and/or secretions or excretions, such as sweat, saliva, sputum, nasal mucus, vomit, urine, or feces, on them. Wear gloves when handling laundry from the patient. Read and follow directions on labels of laundry or clothing items and detergent. In general, wash and dry with the warmest temperatures recommended on the label.  Clean all areas the individual has used often Clean all touchable surfaces, such as counters, tabletops, doorknobs, bathroom fixtures, toilets, phones, keyboards, tablets, and bedside tables, every day. Also, clean any surfaces that may have blood, body fluids, and/or secretions or excretions on them. Wear gloves when cleaning surfaces the patient has come in contact with. Use a diluted bleach solution  (e.g., dilute bleach with 1 part bleach and 10 parts water) or a household disinfectant with a label that says EPA-registered for coronaviruses. To make a bleach solution at home, add 1 tablespoon of bleach to 1 quart (4 cups) of water. For a larger supply, add  cup of bleach to 1 gallon (16 cups) of water. Read labels of cleaning products and follow recommendations provided on product labels. Labels contain instructions for safe and effective use of the cleaning product including precautions you should take when applying the product, such as wearing gloves or eye protection and making sure you have good ventilation during use of the product. Remove gloves and wash hands immediately after cleaning.  Monitor yourself for signs and symptoms of illness Caregivers and household members are considered close contacts, should monitor their health, and will be asked to limit movement outside of the home to the extent possible. Follow the monitoring steps for close contacts listed on the symptom monitoring form.   ? If you have additional questions, contact your local health department or call the epidemiologist on call at [phone] (available 24/7). ? This  guidance is subject to change. For the most up-to-date guidance from [facility], please refer to their website: YouBlogs.pl

## 2020-08-22 ENCOUNTER — Telehealth: Payer: Self-pay | Admitting: Infectious Diseases

## 2020-08-22 NOTE — Telephone Encounter (Signed)
Called to Discuss with patient about Covid symptoms and the use of the monoclonal antibody infusion for those with mild to moderate Covid symptoms and at a high risk of hospitalization.     Pt appears to qualify for this infusion due to co-morbid conditions and/or a member of an at-risk group in accordance with the FDA Emergency Use Authorization.    ER visit yesterday describes symptom onset was 9/29.  Qualifiers include BMI > 25, SVI risk score 4, hx of asthma, smoker, unvaccinated.   He has felt a little better since he has had an inhaler and muscle relaxer.   Would like to think about this and let me know.    Rexene Alberts, MSN, NP-C [facility] for Infectious Disease [facility]  [email or web address] Pager: [phone] Office: [phone] [facility] Main Line: [phone]

## 2020-08-24 ENCOUNTER — Telehealth (INDEPENDENT_AMBULATORY_CARE_PROVIDER_SITE_OTHER): Payer: BC Managed Care – PPO | Admitting: Family Medicine

## 2020-08-24 ENCOUNTER — Encounter: Payer: Self-pay | Admitting: Family Medicine

## 2020-08-24 DIAGNOSIS — U071 COVID-19: Secondary | ICD-10-CM | POA: Diagnosis not present

## 2020-08-24 NOTE — Progress Notes (Signed)
COVID positive 9/29 loss of smell and taste.  SOB on 9/30. Tested positive 9/30.  No symptoms.

## 2020-08-24 NOTE — Assessment & Plan Note (Signed)
He is asymptomatic at this point.  He will complete 10 day quarantine from onset of symptoms, ending 08/29/20.  Discussed that he can get vaccine as soon as he has recovered and quarantine ends.  He plans to get this towards the end of the month. He will contact the clinic if symptoms worsen again.

## 2020-08-24 NOTE — Progress Notes (Signed)
James Klein - 34 y.o. male MRN [id number]  Date of birth: Nov 26, 1985   This visit type was conducted due to national recommendations for restrictions regarding the COVID-19 Pandemic (e.g. social distancing).  This format is felt to be most appropriate for this patient at this time.  All issues noted in this document were discussed and addressed.  No physical exam was performed (except for noted visual exam findings with Video Visits).  I discussed the limitations of evaluation and management by telemedicine and the availability of in person appointments. The patient expressed understanding and agreed to proceed.  I connected with@ on 08/24/20 at 11:10 AM EDT by a video enabled telemedicine application and verified that I am speaking with the correct person using two identifiers.  Present at visit: Luetta Nutting, DO Ronnie Doss   Patient Location: Home [address] [place] [address]   Provider location:   [facility]  Chief Complaint  Patient presents with  . Covid Positive    HPI  James Klein is a 34 y.o. male who presents via audio/video conferencing for a telehealth visit today.  He is following up today from recent COVID-19 infection.  He developed symptoms on 9/29 starting as loss of smell and taste.  Progressed to some chest tightness and shortness of breath and was see in the ED on 10/2 and tested + for COVID.  Exam and O2 sats reassuring and he was discharged home.  He was contacted about monoclonal antibody infusion the following day however he didn't know what this was so he decided to not get this.  Today he reports that he is feeling much better.  He denies symptoms at this time.  He does plan to get vaccinated and wanted to know if this was ok to have completed after infection.        ROS:  A comprehensive ROS was completed and negative except as noted per HPI  Past Medical History:  Diagnosis Date  . Asthma     History reviewed. No pertinent  surgical history.  History reviewed. No pertinent family history.  Social History   Socioeconomic History  . Marital status: Single    Spouse name: Not on file  . Number of children: Not on file  . Years of education: Not on file  . Highest education level: Not on file  Occupational History  . Not on file  Tobacco Use  . Smoking status: Never Smoker  . Smokeless tobacco: Never Used  Vaping Use  . Vaping Use: Never used  Substance and Sexual Activity  . Alcohol use: Yes    Comment: occass.   . Drug use: No  . Sexual activity: Not on file  Other Topics Concern  . Not on file  Social History Narrative  . Not on file   Social Determinants of Health   Financial Resource Strain:   . Difficulty of Paying Living Expenses: Not on file  Food Insecurity:   . Worried About Charity fundraiser in the Last Year: Not on file  . Ran Out of Food in the Last Year: Not on file  Transportation Needs:   . Lack of Transportation (Medical): Not on file  . Lack of Transportation (Non-Medical): Not on file  Physical Activity:   . Days of Exercise per Week: Not on file  . Minutes of Exercise per Session: Not on file  Stress:   . Feeling of Stress : Not on file  Social Connections:   . Frequency  of Communication with Friends and Family: Not on file  . Frequency of Social Gatherings with Friends and Family: Not on file  . Attends Religious Services: Not on file  . Active Member of Clubs or Organizations: Not on file  . Attends Archivist Meetings: Not on file  . Marital Status: Not on file  Intimate Partner Violence:   . Fear of Current or Ex-Partner: Not on file  . Emotionally Abused: Not on file  . Physically Abused: Not on file  . Sexually Abused: Not on file     Current Outpatient Medications:  .  COVID-19 Specimen Collection KIT, See admin instructions. for testing, Disp: , Rfl:  .  Diclofenac Sodium (PENNSAID) 2 % SOLN, Place 1 application onto the skin 2 (two) times  daily., Disp: 1 Bottle, Rfl: 3 .  fluticasone (FLONASE) 50 MCG/ACT nasal spray, Place 2 sprays into both nostrils daily., Disp: 16 g, Rfl: 6 .  ibuprofen (ADVIL) 600 MG tablet, Take 1 tablet (600 mg total) by mouth every 6 (six) hours as needed for up to 30 doses for mild pain or moderate pain., Disp: 30 tablet, Rfl: 0 .  methocarbamol (ROBAXIN) 500 MG tablet, Take 1 tablet (500 mg total) by mouth every 8 (eight) hours as needed for muscle spasms., Disp: 15 tablet, Rfl: 0  EXAM:  VITALS per patient if applicable: Temp 98 F (16.1 C)   Ht 6' 2.5" (1.892 m)   Wt (!) 324 lb (147 kg)   BMI 41.04 kg/m   GENERAL: alert, oriented, appears well and in no acute distress  HEENT: atraumatic, conjunttiva clear, no obvious abnormalities on inspection of external nose and ears  NECK: normal movements of the head and neck  LUNGS: on inspection no signs of respiratory distress, breathing rate appears normal, no obvious gross SOB, gasping or wheezing  CV: no obvious cyanosis  MS: moves all visible extremities without noticeable abnormality  PSYCH/NEURO: pleasant and cooperative, no obvious depression or anxiety, speech and thought processing grossly intact  ASSESSMENT AND PLAN:  Discussed the following assessment and plan:  COVID-19 He is asymptomatic at this point.  He will complete 10 day quarantine from onset of symptoms, ending 08/29/20.  Discussed that he can get vaccine as soon as he has recovered and quarantine ends.  He plans to get this towards the end of the month. He will contact the clinic if symptoms worsen again.        I discussed the assessment and treatment plan with the patient. The patient was provided an opportunity to ask questions and all were answered. The patient agreed with the plan and demonstrated an understanding of the instructions.   The patient was advised to call back or seek an in-person evaluation if the symptoms worsen or if the condition fails to improve  as anticipated.    Luetta Nutting, DO

## 2020-08-25 ENCOUNTER — Telehealth: Payer: Self-pay | Admitting: Family Medicine

## 2020-08-25 NOTE — Telephone Encounter (Signed)
Pt was called per referral from [facility]. unable to lvm

## 2020-09-28 ENCOUNTER — Ambulatory Visit (INDEPENDENT_AMBULATORY_CARE_PROVIDER_SITE_OTHER): Payer: BC Managed Care – PPO | Admitting: Family Medicine

## 2020-09-28 ENCOUNTER — Encounter: Payer: Self-pay | Admitting: Family Medicine

## 2020-09-28 VITALS — BP 138/98 | HR 70 | Temp 97.8°F | Ht 74.0 in | Wt 319.8 lb

## 2020-09-28 DIAGNOSIS — Z23 Encounter for immunization: Secondary | ICD-10-CM | POA: Diagnosis not present

## 2020-09-28 DIAGNOSIS — Z0000 Encounter for general adult medical examination without abnormal findings: Secondary | ICD-10-CM

## 2020-09-28 DIAGNOSIS — Z13220 Encounter for screening for lipoid disorders: Secondary | ICD-10-CM

## 2020-09-28 DIAGNOSIS — Z202 Contact with and (suspected) exposure to infections with a predominantly sexual mode of transmission: Secondary | ICD-10-CM

## 2020-09-28 DIAGNOSIS — Z1159 Encounter for screening for other viral diseases: Secondary | ICD-10-CM

## 2020-09-28 DIAGNOSIS — Z114 Encounter for screening for human immunodeficiency virus [HIV]: Secondary | ICD-10-CM

## 2020-09-28 NOTE — Patient Instructions (Signed)
Preventive Care 19-32 Years Old, Male Preventive care refers to lifestyle choices and visits with your health care provider that can promote health and wellness. This includes:  A yearly physical exam. This is also called an annual well check.  Regular dental and eye exams.  Immunizations.  Screening for certain conditions.  Healthy lifestyle choices, such as eating a healthy diet, getting regular exercise, not using drugs or products that contain nicotine and tobacco, and limiting alcohol use. What can I expect for my preventive care visit? Physical exam Your health care provider will check:  Height and weight. These may be used to calculate body mass index (BMI), which is a measurement that tells if you are at a healthy weight.  Heart rate and blood pressure.  Your skin for abnormal spots. Counseling Your health care provider may ask you questions about:  Alcohol, tobacco, and drug use.  Emotional well-being.  Home and relationship well-being.  Sexual activity.  Eating habits.  Work and work Statistician. What immunizations do I need?  Influenza (flu) vaccine  This is recommended every year. Tetanus, diphtheria, and pertussis (Tdap) vaccine  You may need a Td booster every 10 years. Varicella (chickenpox) vaccine  You may need this vaccine if you have not already been vaccinated. Human papillomavirus (HPV) vaccine  If recommended by your health care provider, you may need three doses over 6 months. Measles, mumps, and rubella (MMR) vaccine  You may need at least one dose of MMR. You may also need a second dose. Meningococcal conjugate (MenACWY) vaccine  One dose is recommended if you are 45-76 years old and a Market researcher living in a residence hall, or if you have one of several medical conditions. You may also need additional booster doses. Pneumococcal conjugate (PCV13) vaccine  You may need this if you have certain conditions and were not  previously vaccinated. Pneumococcal polysaccharide (PPSV23) vaccine  You may need one or two doses if you smoke cigarettes or if you have certain conditions. Hepatitis A vaccine  You may need this if you have certain conditions or if you travel or work in places where you may be exposed to hepatitis A. Hepatitis B vaccine  You may need this if you have certain conditions or if you travel or work in places where you may be exposed to hepatitis B. Haemophilus influenzae type b (Hib) vaccine  You may need this if you have certain risk factors. You may receive vaccines as individual doses or as more than one vaccine together in one shot (combination vaccines). Talk with your health care provider about the risks and benefits of combination vaccines. What tests do I need? Blood tests  Lipid and cholesterol levels. These may be checked every 5 years starting at age 17.  Hepatitis C test.  Hepatitis B test. Screening   Diabetes screening. This is done by checking your blood sugar (glucose) after you have not eaten for a while (fasting).  Sexually transmitted disease (STD) testing. Talk with your health care provider about your test results, treatment options, and if necessary, the need for more tests. Follow these instructions at home: Eating and drinking   Eat a diet that includes fresh fruits and vegetables, whole grains, lean protein, and low-fat dairy products.  Take vitamin and mineral supplements as recommended by your health care provider.  Do not drink alcohol if your health care provider tells you not to drink.  If you drink alcohol: ? Limit how much you have to 0-2  drinks a day. ? Be aware of how much alcohol is in your drink. In the [place] one drink equals one 12 oz bottle of beer (355 mL), one 5 oz glass of wine (148 mL), or one 1 oz glass of hard liquor (44 mL). Lifestyle  Take daily care of your teeth and gums.  Stay active. Exercise for at least 30 minutes on 5 or  more days each week.  Do not use any products that contain nicotine or tobacco, such as cigarettes, e-cigarettes, and chewing tobacco. If you need help quitting, ask your health care provider.  If you are sexually active, practice safe sex. Use a condom or other form of protection to prevent STIs (sexually transmitted infections). What's next?  Go to your health care provider once a year for a well check visit.  Ask your health care provider how often you should have your eyes and teeth checked.  Stay up to date on all vaccines. This information is not intended to replace advice given to you by your health care provider. Make sure you discuss any questions you have with your health care provider. Document Revised: 10/31/2018 Document Reviewed: 10/31/2018 Elsevier Patient Education  2020 [employer].

## 2020-09-28 NOTE — Progress Notes (Signed)
James Klein - 34 y.o. male MRN [id number]  Date of birth: June 17, 1986  Subjective Chief Complaint  Patient presents with  . Annual Exam    HPI James Klein is a 34 y.o. male here today for annual exam. He has been pretty healthy.  He would like to discuss COVID vaccine today.    He would like to have updated labs today including STD screenings.  He denies symptoms.    He smokes cigarettes or black and mild occasionally.   Alcohol intake varies but denies binging or excessive EtOH intake.   He is working on becoming more active and making changes to his diet.   Review of Systems  Constitutional: Negative for chills, fever, malaise/fatigue and weight loss.  HENT: Negative for congestion, ear pain and sore throat.   Eyes: Negative for blurred vision, double vision and pain.  Respiratory: Negative for cough and shortness of breath.   Cardiovascular: Negative for chest pain and palpitations.  Gastrointestinal: Negative for abdominal pain, blood in stool, constipation, heartburn and nausea.  Genitourinary: Negative for dysuria and urgency.  Musculoskeletal: Negative for joint pain and myalgias.  Neurological: Negative for dizziness and headaches.  Endo/Heme/Allergies: Does not bruise/bleed easily.  Psychiatric/Behavioral: Negative for depression. The patient is not nervous/anxious and does not have insomnia.     No Known Allergies  Past Medical History:  Diagnosis Date  . Asthma     No past surgical history on file.  Social History   Socioeconomic History  . Marital status: Single    Spouse name: Not on file  . Number of children: Not on file  . Years of education: Not on file  . Highest education level: Not on file  Occupational History  . Not on file  Tobacco Use  . Smoking status: Never Smoker  . Smokeless tobacco: Never Used  Vaping Use  . Vaping Use: Never used  Substance and Sexual Activity  . Alcohol use: Yes    Comment: occass.   . Drug use: No  .  Sexual activity: Not on file  Other Topics Concern  . Not on file  Social History Narrative  . Not on file   Social Determinants of Health   Financial Resource Strain:   . Difficulty of Paying Living Expenses: Not on file  Food Insecurity:   . Worried About Programme researcher, broadcasting/film/video in the Last Year: Not on file  . Ran Out of Food in the Last Year: Not on file  Transportation Needs:   . Lack of Transportation (Medical): Not on file  . Lack of Transportation (Non-Medical): Not on file  Physical Activity:   . Days of Exercise per Week: Not on file  . Minutes of Exercise per Session: Not on file  Stress:   . Feeling of Stress : Not on file  Social Connections:   . Frequency of Communication with Friends and Family: Not on file  . Frequency of Social Gatherings with Friends and Family: Not on file  . Attends Religious Services: Not on file  . Active Member of Clubs or Organizations: Not on file  . Attends Banker Meetings: Not on file  . Marital Status: Not on file    No family history on file.  Health Maintenance  Topic Date Due  . Hepatitis C Screening  Never done  . COVID-19 Vaccine (1) Never done  . INFLUENZA VACCINE  Never done  . TETANUS/TDAP  09/28/2030  . HIV Screening  Completed     -----------------------------------------------------------------------------------------------------------------------------------------------------------------------------------------------------------------  Physical Exam BP (!) 138/98 (BP Location: Left Arm, Patient Position: Sitting, Cuff Size: Large)   Pulse 70   Temp 97.8 F (36.6 C)   Ht 6\' 2"  (1.88 m)   Wt (!) 319 lb 12.8 oz (145.1 kg)   SpO2 100%   BMI 41.06 kg/m   Physical Exam Constitutional:      General: He is not in acute distress. HENT:     Head: Normocephalic and atraumatic.     Right Ear: External ear normal.     Left Ear: External ear normal.  Eyes:     General: No scleral icterus. Neck:      Thyroid: No thyromegaly.  Cardiovascular:     Rate and Rhythm: Normal rate and regular rhythm.     Heart sounds: Normal heart sounds.  Pulmonary:     Effort: Pulmonary effort is normal.     Breath sounds: Normal breath sounds.  Abdominal:     General: Bowel sounds are normal. There is no distension.     Palpations: Abdomen is soft.     Tenderness: There is no abdominal tenderness. There is no guarding.  Musculoskeletal:     Cervical back: Normal range of motion.  Lymphadenopathy:     Cervical: No cervical adenopathy.  Skin:    General: Skin is warm and dry.     Findings: No rash.  Neurological:     Mental Status: He is alert and oriented to person, place, and time.     Cranial Nerves: No cranial nerve deficit.     Motor: No abnormal muscle tone.  Psychiatric:        Mood and Affect: Mood normal.        Behavior: Behavior normal.     ------------------------------------------------------------------------------------------------------------------------------------------------------------------------------------------------------------------- Assessment and Plan  Well adult exam Well adult Orders Placed This Encounter  Procedures  . Chlamydia/Neisseria Gonorrhoeae RNA,TMA,Urogenital  . Tdap vaccine greater than or equal to 7yo IM  . COMPLETE METABOLIC PANEL WITH GFR  . CBC  . Lipid Panel w/reflex Direct LDL  . RPR  . HIV antibody (with reflex)  . Hepatitis C Antibody  Immunizations:  Tdap.  Declines flu.  He is strongly considering getting COVID vaccine, counseling provided and recommend he receive this.  Screenings:  Lipid.  He also desires to have STD screening.  Anticipatory guidance/risk factor reduction:  BP elevated today.  Recommend increased exercise and low sodium diet.  Additional recommendations per AVS.     No orders of the defined types were placed in this encounter.   No follow-ups on file.    This visit occurred during the SARS-CoV-2 public health  emergency.  Safety protocols were in place, including screening questions prior to the visit, additional usage of staff PPE, and extensive cleaning of exam room while observing appropriate contact time as indicated for disinfecting solutions.

## 2020-09-28 NOTE — Assessment & Plan Note (Signed)
Well adult Orders Placed This Encounter  Procedures  . Chlamydia/Neisseria Gonorrhoeae RNA,TMA,Urogenital  . Tdap vaccine greater than or equal to 32yo IM  . COMPLETE METABOLIC PANEL WITH GFR  . CBC  . Lipid Panel w/reflex Direct LDL  . RPR  . HIV antibody (with reflex)  . Hepatitis C Antibody  Immunizations:  Tdap.  Declines flu.  He is strongly considering getting COVID vaccine, counseling provided and recommend he receive this.  Screenings:  Lipid.  He also desires to have STD screening.  Anticipatory guidance/risk factor reduction:  BP elevated today.  Recommend increased exercise and low sodium diet.  Additional recommendations per AVS.

## 2020-10-05 ENCOUNTER — Emergency Department (HOSPITAL_COMMUNITY)
Admission: EM | Admit: 2020-10-05 | Discharge: 2020-10-05 | Disposition: A | Discharge status: Home / Self Care | Payer: BC Managed Care – PPO | Attending: Emergency Medicine | Admitting: Emergency Medicine

## 2020-10-05 ENCOUNTER — Other Ambulatory Visit: Payer: Self-pay

## 2020-10-05 ENCOUNTER — Encounter (HOSPITAL_COMMUNITY): Payer: Self-pay

## 2020-10-05 DIAGNOSIS — R195 Other fecal abnormalities: Secondary | ICD-10-CM | POA: Insufficient documentation

## 2020-10-05 DIAGNOSIS — Z79899 Other long term (current) drug therapy: Secondary | ICD-10-CM | POA: Insufficient documentation

## 2020-10-05 DIAGNOSIS — R82998 Other abnormal findings in urine: Secondary | ICD-10-CM | POA: Diagnosis not present

## 2020-10-05 DIAGNOSIS — R319 Hematuria, unspecified: Secondary | ICD-10-CM | POA: Diagnosis not present

## 2020-10-05 DIAGNOSIS — J45909 Unspecified asthma, uncomplicated: Secondary | ICD-10-CM | POA: Diagnosis not present

## 2020-10-05 DIAGNOSIS — K921 Melena: Secondary | ICD-10-CM | POA: Diagnosis not present

## 2020-10-05 LAB — CBC
HCT: 47.3 % (ref 39.0–52.0)
Hemoglobin: 15.5 g/dL (ref 13.0–17.0)
MCH: 32.6 pg (ref 26.0–34.0)
MCHC: 32.8 g/dL (ref 30.0–36.0)
MCV: 99.4 fL (ref 80.0–100.0)
Platelets: 228 10*3/uL (ref 150–400)
RBC: 4.76 MIL/uL (ref 4.22–5.81)
RDW: 12.3 % (ref 11.5–15.5)
WBC: 5.8 10*3/uL (ref 4.0–10.5)
nRBC: 0 % (ref 0.0–0.2)

## 2020-10-05 LAB — COMPREHENSIVE METABOLIC PANEL
ALT: 41 U/L (ref 0–44)
AST: 42 U/L — ABNORMAL HIGH (ref 15–41)
Albumin: 3.5 g/dL (ref 3.5–5.0)
Alkaline Phosphatase: 77 U/L (ref 38–126)
Anion gap: 7 (ref 5–15)
BUN: 10 mg/dL (ref 6–20)
CO2: 29 mmol/L (ref 22–32)
Calcium: 8.8 mg/dL — ABNORMAL LOW (ref 8.9–10.3)
Chloride: 104 mmol/L (ref 98–111)
Creatinine, Ser: 1.4 mg/dL — ABNORMAL HIGH (ref 0.61–1.24)
GFR, Estimated: >60 mL/min (ref >60)
Glucose, Bld: 102 mg/dL — ABNORMAL HIGH (ref 70–99)
Potassium: 3.8 mmol/L (ref 3.5–5.1)
Sodium: 140 mmol/L (ref 135–145)
Total Bilirubin: 0.6 mg/dL (ref 0.3–1.2)
Total Protein: 6.3 g/dL — ABNORMAL LOW (ref 6.5–8.1)

## 2020-10-05 LAB — URINALYSIS, ROUTINE W REFLEX MICROSCOPIC
Bilirubin Urine: NEGATIVE
Glucose, UA: NEGATIVE mg/dL
Hgb urine dipstick: NEGATIVE
Ketones, ur: NEGATIVE mg/dL
Leukocytes,Ua: NEGATIVE
Nitrite: NEGATIVE
Protein, ur: NEGATIVE mg/dL
Specific Gravity, Urine: 1.02 (ref 1.005–1.030)
pH: 6 (ref 5.0–8.0)

## 2020-10-05 NOTE — Discharge Instructions (Signed)
Your blood level is normal.  Please follow-up with your family doctor in the office.  They may want to refer you to a GI doctor or a urologist if you have continued symptoms.

## 2020-10-05 NOTE — ED Provider Notes (Signed)
[facility] EMERGENCY DEPARTMENT Provider Note   CSN: [id number] Arrival date & time: 10/05/20  1015     History Chief Complaint  Patient presents with  . Hematuria  . Melena    James Klein is a 34 y.o. male.  34 yo M with a chief complaints of hematuria.  He states his urine was dark a few days ago and now is resolved.  He also thinks his blood was somewhat dark red-colored.  This also was a few days ago and resolved.  He denies abdominal pain denies fevers.  Denies trauma.  He is slightly worried that he might have an STD.  Denies any recent sexual contact but states he was never tested in his life.  The history is provided by the patient.  Hematuria This is a new problem. The current episode started more than 2 days ago. The problem occurs rarely. The problem has been resolved. Pertinent negatives include no chest pain, no abdominal pain, no headaches and no shortness of breath. Nothing aggravates the symptoms. Nothing relieves the symptoms. He has tried nothing for the symptoms. The treatment provided no relief.       Past Medical History:  Diagnosis Date  . Asthma     Patient Active Problem List   Diagnosis Date Noted  . COVID-19 08/24/2020  . Allergic rhinitis 03/03/2019  . URI (upper respiratory infection) 10/31/2018  . Right foot pain 10/08/2018  . Well adult exam 09/09/2018  . Constipation 08/26/2018    History reviewed. No pertinent surgical history.     No family history on file.  Social History   Tobacco Use  . Smoking status: Never Smoker  . Smokeless tobacco: Never Used  Vaping Use  . Vaping Use: Never used  Substance Use Topics  . Alcohol use: Yes    Comment: occass.   . Drug use: No    Home Medications Prior to Admission medications   Medication Sig Start Date End Date Taking? Authorizing Provider  Diclofenac Sodium (PENNSAID) 2 % SOLN Place 1 application onto the skin 2 (two) times daily. Patient taking  differently: Place 1 application onto the skin 2 (two) times daily as needed.  10/07/18  Yes Rosemarie Ax, MD  fluticasone [facility]) 50 MCG/ACT nasal spray Place 2 sprays into both nostrils daily. 03/03/19  Yes Luetta Nutting, DO  methocarbamol (ROBAXIN) 500 MG tablet Take 1 tablet (500 mg total) by mouth every 8 (eight) hours as needed for muscle spasms. 08/21/20  Yes Petrucelli, Samantha R, PA-C  multivitamin (ONE-A-DAY MEN'S) TABS tablet Take 1 tablet by mouth daily.   Yes [provider]  COVID-19 Specimen Collection KIT See admin instructions. for testing 08/19/20   [provider]  ibuprofen (ADVIL) 600 MG tablet Take 1 tablet (600 mg total) by mouth every 6 (six) hours as needed for up to 30 doses for mild pain or moderate pain. Patient not taking: Reported on 10/05/2020 04/05/20   Wyvonnia Dusky, MD  albuterol (PROVENTIL HFA;VENTOLIN HFA) 108 (90 Base) MCG/ACT inhaler Inhale 2 puffs into the lungs every 6 (six) hours as needed for wheezing or shortness of breath. Patient not taking: Reported on 04/09/2020 10/31/18 08/21/20  Luetta Nutting, DO    Allergies    Patient has no known allergies.  Review of Systems   Review of Systems  Constitutional: Negative for chills and fever.  HENT: Negative for congestion and facial swelling.   Eyes: Negative for discharge and visual disturbance.  Respiratory: Negative for  shortness of breath.   Cardiovascular: Negative for chest pain and palpitations.  Gastrointestinal: Positive for blood in stool. Negative for abdominal pain, diarrhea and vomiting.  Genitourinary: Positive for hematuria.  Musculoskeletal: Negative for arthralgias and myalgias.  Skin: Negative for color change and rash.  Neurological: Negative for tremors, syncope and headaches.  Psychiatric/Behavioral: Negative for confusion and dysphoric mood.    Physical Exam Updated Vital Signs BP (!) 141/86   Pulse 64   Temp 98.8 F (37.1 C) (Oral)   Resp 16   Ht  6' 2.5" (1.892 m)   Wt (!) 145.2 kg   SpO2 100%   BMI 40.54 kg/m   Physical Exam Vitals and nursing note reviewed.  Constitutional:      Appearance: He is well-developed.  HENT:     Head: Normocephalic and atraumatic.  Eyes:     Pupils: Pupils are equal, round, and reactive to light.  Neck:     Vascular: No JVD.  Cardiovascular:     Rate and Rhythm: Normal rate and regular rhythm.     Heart sounds: No murmur heard.  No friction rub. No gallop.   Pulmonary:     Effort: No respiratory distress.     Breath sounds: No wheezing.  Abdominal:     General: There is no distension.     Tenderness: There is no abdominal tenderness. There is no guarding or rebound.  Genitourinary:    Comments: Circumcised.  No testicular pain or swelling.  No hernias.  No rash.  No hemorrhoids.  No gross blood.  No fissure. Musculoskeletal:        General: Normal range of motion.     Cervical back: Normal range of motion and neck supple.  Skin:    Coloration: Skin is not pale.     Findings: No rash.  Neurological:     Mental Status: He is alert and oriented to person, place, and time.  Psychiatric:        Behavior: Behavior normal.     ED Results / Procedures / Treatments   Labs (all labs ordered are listed, but only abnormal results are displayed) Labs Reviewed  COMPREHENSIVE METABOLIC PANEL - Abnormal; Notable for the following components:      Result Value   Glucose, Bld 102 (*)    Creatinine, Ser 1.40 (*)    Calcium 8.8 (*)    Total Protein 6.3 (*)    AST 42 (*)    All other components within normal limits  CBC  URINALYSIS, ROUTINE W REFLEX MICROSCOPIC  RPR  HIV ANTIBODY (ROUTINE TESTING W REFLEX)  GC/CHLAMYDIA PROBE AMP ([place]) NOT AT [facility]    EKG None  Radiology No results found.  Procedures Procedures (including critical care time)  Medications Ordered in ED Medications - No data to display  ED Course  I have reviewed the triage vital signs and the nursing  notes.  Pertinent labs & imaging results that were available during my care of the patient were reviewed by me and considered in my medical decision making (see chart for details).    MDM Rules/Calculators/A&P                          34 yo M with a chief complaints of some dark coloration to his urine and a red discoloration to his stool.  This was a few days ago and now resolved.  His hemoglobin is normal.  He has had no further  events since then.  I am unsure if this was just a function of something he ate or maybe he was dehydrated.  We will have him follow-up with his family doctor in the office.  1:38 PM:  I have discussed the diagnosis/risks/treatment options with the patient and believe the pt to be eligible for discharge home to follow-up with PCP. We also discussed returning to the ED immediately if new or worsening sx occur. We discussed the sx which are most concerning (e.g., sudden worsening pain, fever, inability to tolerate by mouth) that necessitate immediate return. Medications administered to the patient during their visit and any new prescriptions provided to the patient are listed below.  Medications given during this visit Medications - No data to display   The patient appears reasonably screen and/or stabilized for discharge and I doubt any other medical condition or other [facility] requiring further screening, evaluation, or treatment in the ED at this time prior to discharge.   Final Clinical Impression(s) / ED Diagnoses Final diagnoses:  Dark urine  Red stool    Rx / DC Orders ED Discharge Orders    None       Deno Etienne, DO 10/05/20 1338

## 2020-10-05 NOTE — ED Triage Notes (Signed)
Pt reports blood in urine and dark red blood in his stool since yesterday. Pt denies any pain.

## 2020-10-06 LAB — HIV ANTIBODY (ROUTINE TESTING W REFLEX): HIV Screen 4th Generation wRfx: NONREACTIVE

## 2020-10-06 LAB — RPR: RPR Ser Ql: NONREACTIVE

## 2021-03-04 ENCOUNTER — Emergency Department (HOSPITAL_COMMUNITY)
Admission: EM | Admit: 2021-03-04 | Discharge: 2021-03-04 | Disposition: A | Discharge status: Home / Self Care | Payer: BC Managed Care – PPO | Attending: Emergency Medicine | Admitting: Emergency Medicine

## 2021-03-04 ENCOUNTER — Emergency Department (HOSPITAL_COMMUNITY): Payer: BC Managed Care – PPO

## 2021-03-04 ENCOUNTER — Encounter (HOSPITAL_COMMUNITY): Payer: Self-pay

## 2021-03-04 ENCOUNTER — Other Ambulatory Visit: Payer: Self-pay

## 2021-03-04 DIAGNOSIS — S90111A Contusion of right great toe without damage to nail, initial encounter: Secondary | ICD-10-CM | POA: Insufficient documentation

## 2021-03-04 DIAGNOSIS — S62666A Nondisplaced fracture of distal phalanx of right little finger, initial encounter for closed fracture: Secondary | ICD-10-CM | POA: Insufficient documentation

## 2021-03-04 DIAGNOSIS — S62306A Unspecified fracture of fifth metacarpal bone, right hand, initial encounter for closed fracture: Secondary | ICD-10-CM | POA: Diagnosis not present

## 2021-03-04 DIAGNOSIS — Z8616 Personal history of COVID-19: Secondary | ICD-10-CM | POA: Insufficient documentation

## 2021-03-04 DIAGNOSIS — Z7951 Long term (current) use of inhaled steroids: Secondary | ICD-10-CM | POA: Diagnosis not present

## 2021-03-04 DIAGNOSIS — S62316A Displaced fracture of base of fifth metacarpal bone, right hand, initial encounter for closed fracture: Secondary | ICD-10-CM | POA: Insufficient documentation

## 2021-03-04 DIAGNOSIS — J45909 Unspecified asthma, uncomplicated: Secondary | ICD-10-CM | POA: Diagnosis not present

## 2021-03-04 DIAGNOSIS — W2209XA Striking against other stationary object, initial encounter: Secondary | ICD-10-CM | POA: Insufficient documentation

## 2021-03-04 DIAGNOSIS — S62339A Displaced fracture of neck of unspecified metacarpal bone, initial encounter for closed fracture: Secondary | ICD-10-CM

## 2021-03-04 DIAGNOSIS — M79641 Pain in right hand: Secondary | ICD-10-CM | POA: Diagnosis not present

## 2021-03-04 DIAGNOSIS — S99921A Unspecified injury of right foot, initial encounter: Secondary | ICD-10-CM | POA: Diagnosis not present

## 2021-03-04 DIAGNOSIS — S6991XA Unspecified injury of right wrist, hand and finger(s), initial encounter: Secondary | ICD-10-CM | POA: Diagnosis not present

## 2021-03-04 NOTE — ED Provider Notes (Signed)
[facility] DEPT Provider Note   CSN: [id number] Arrival date & time: 03/04/21  1151     History Chief Complaint  Patient presents with  . Hand Injury  . Toe Injury    James Klein. is a 35 y.o. male.  35 year old male presents with complaint of right hand pain after punching a wall 2 weeks ago.  Pain noted along fifth metacarpal.  Patient is right-hand dominant, skin is intact.  Also reports pain in his right great toe after kicking a step into place while wearing closed toe shoes 4 days ago at work.  No other injuries, complaints, concerns.        Past Medical History:  Diagnosis Date  . Asthma     Patient Active Problem List   Diagnosis Date Noted  . COVID-19 08/24/2020  . Allergic rhinitis 03/03/2019  . URI (upper respiratory infection) 10/31/2018  . Right foot pain 10/08/2018  . Well adult exam 09/09/2018  . Constipation 08/26/2018    History reviewed. No pertinent surgical history.     Family History  Problem Relation Age of Onset  . Healthy Mother   . Healthy Father     Social History   Tobacco Use  . Smoking status: Never Smoker  . Smokeless tobacco: Never Used  Vaping Use  . Vaping Use: Never used  Substance Use Topics  . Alcohol use: Yes    Comment: occass.   . Drug use: No    Home Medications Prior to Admission medications   Medication Sig Start Date End Date Taking? Authorizing Provider  COVID-19 Specimen Collection KIT See admin instructions. for testing 08/19/20   [provider]  Diclofenac Sodium (PENNSAID) 2 % SOLN Place 1 application onto the skin 2 (two) times daily. Patient taking differently: Place 1 application onto the skin 2 (two) times daily as needed.  10/07/18   Rosemarie Ax, MD  fluticasone (FLONASE) 50 MCG/ACT nasal spray Place 2 sprays into both nostrils daily. 03/03/19   Luetta Nutting, DO  ibuprofen (ADVIL) 600 MG tablet Take 1 tablet (600 mg total) by mouth every 6  (six) hours as needed for up to 30 doses for mild pain or moderate pain. Patient not taking: Reported on 10/05/2020 04/05/20   Wyvonnia Dusky, MD  methocarbamol (ROBAXIN) 500 MG tablet Take 1 tablet (500 mg total) by mouth every 8 (eight) hours as needed for muscle spasms. 08/21/20   Petrucelli, Samantha R, PA-C  multivitamin (ONE-A-DAY MEN'S) TABS tablet Take 1 tablet by mouth daily.    [provider]  albuterol (PROVENTIL HFA;VENTOLIN HFA) 108 (90 Base) MCG/ACT inhaler Inhale 2 puffs into the lungs every 6 (six) hours as needed for wheezing or shortness of breath. Patient not taking: Reported on 04/09/2020 10/31/18 08/21/20  Luetta Nutting, DO    Allergies    Patient has no known allergies.  Review of Systems   Review of Systems  Constitutional: Negative for fever.  Musculoskeletal: Positive for arthralgias, joint swelling and myalgias.  Skin: Negative for color change, rash and wound.  Allergic/Immunologic: Negative for immunocompromised state.  Neurological: Negative for weakness and numbness.  Hematological: Negative for adenopathy. Does not bruise/bleed easily.  Psychiatric/Behavioral: Negative for self-injury.  All other systems reviewed and are negative.   Physical Exam Updated Vital Signs BP (!) 149/93 (BP Location: Right Arm)   Pulse 85   Temp 99.2 F (37.3 C) (Oral)   Resp 20   Ht 6' 2.5" (1.892 m)   [facility] Marland Kitchen)  142.9 kg   SpO2 99%   BMI 39.90 kg/m   Physical Exam Vitals and nursing note reviewed.  Constitutional:      General: He is not in acute distress.    Appearance: He is well-developed. He is not diaphoretic.  HENT:     Head: Normocephalic and atraumatic.  Cardiovascular:     Pulses: Normal pulses.  Pulmonary:     Effort: Pulmonary effort is normal.  Musculoskeletal:        General: Tenderness and deformity present. No swelling.     Comments: Mild deformity along right fifth metacarpal tenderness, normal range of motion with full flexion extension  of all fingers, sensation intact with brisk capillary refill present.  Skin is intact. Mild tenderness with palpation of the right great toe, skin intact, no deformity, no erythema overlying swelling.  Skin:    General: Skin is warm and dry.     Findings: No bruising, erythema or rash.  Neurological:     Mental Status: He is alert and oriented to person, place, and time.     Sensory: No sensory deficit.  Psychiatric:        Behavior: Behavior normal.     ED Results / Procedures / Treatments   Labs (all labs ordered are listed, but only abnormal results are displayed) Labs Reviewed - No data to display  EKG None  Radiology DG Hand Complete Right  Result Date: 03/04/2021 CLINICAL DATA:  35 y.o male c/o right hand pain x 2 weeks. Patient states he hit a wall and c/o pain over the 5th metacarpal. Pt states it has been swollen and he has been icing it. 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 EXAM: RIGHT HAND - COMPLETE 3+ VIEW COMPARISON:  None. FINDINGS: There changes from old fractures of the fifth metacarpal with mature periosteal new bone extending from the mid to distal shaft, and a more subtle area widening at the junction the metacarpal base and shaft. There is a well-defined lucent line that crosses the metacarpal shaft at the area of the old fracture, breaching the dorsal ulnar cortical margin. Fractures angulated anterior over radial direction, likely result of the old fracture. There is a dorsal avulsion fracture from the base of the distal phalanx of the fifth finger, non comminuted, minimally retracted proximally by just over 1 mm. No other fractures. Joints are normally aligned. IMPRESSION: 1. Findings consistent with an acute on chronic fracture of the fifth metacarpal shaft as detailed. No fracture displacement. Fracture angulation is likely related to the old fracture. 2. Avulsion  fracture from the dorsal base of the distal phalanx of the fifth finger of unclear chronicity. 3. No other fractures.  No dislocation. Electronically Signed   By: Lajean Manes M.D.   On: 03/04/2021 12:55   DG Foot Complete Right  Result Date: 03/04/2021 CLINICAL DATA:  Right foot pain after injury 2 weeks ago. EXAM: RIGHT FOOT COMPLETE - 3+ VIEW COMPARISON:  None. FINDINGS: There is no evidence of fracture or dislocation. There is no evidence of arthropathy or other focal bone abnormality. Soft tissues are unremarkable. IMPRESSION: Negative. Electronically Signed   By: Marijo Conception M.D.   On: 03/04/2021 12:53    Procedures Procedures     Medications Ordered in ED Medications - No data to display  ED Course  I have reviewed the triage vital signs and the nursing  notes.  Pertinent labs & imaging results that were available during my care of the patient were reviewed by me and considered in my medical decision making (see chart for details).  Clinical Course as of 03/04/21 1318  Fri Mar 04, 4136  2936 35 year old male with right hand pain after punching a wall 2 weeks ago, found to have pain, swelling, deformity of right long right fifth metacarpal.  X-ray shows boxer's fracture also with fracture of right fifth distal phalanx.  Patient will be placed in a ulnar gutter splint and referred to hand specialist for follow-up.  In regards to his right great toe pain, his x-ray is negative, no joint swelling or erythema to suggest gout or septic joint.  Recommend Motrin and Tylenol, can apply ice between his at a time for pain. [LM]    Clinical Course User Index [LM] Roque Lias   MDM Rules/Calculators/A&P                           Final Clinical Impression(s) / ED Diagnoses Final diagnoses:  Closed boxer's fracture, initial encounter  Closed nondisplaced fracture of distal phalanx of right little finger, initial encounter  Contusion of right great toe without damage to  nail, initial encounter    Rx / DC Orders ED Discharge Orders    None       Tacy Learn, PA-C 03/04/21 1358    Fredia Sorrow, MD 03/04/21 1722

## 2021-03-04 NOTE — Progress Notes (Signed)
Orthopedic Tech Progress Note Patient Details:  James Klein. 07-30-1986 [id number]  Ortho Devices Type of Ortho Device: Ulna gutter splint Ortho Device/Splint Location: Right Hand Ortho Device/Splint Interventions: Application   Post Interventions Patient Tolerated: Well Instructions Provided: Care of device   Israel Werts E Keirstan Iannello 03/04/2021, 1:27 PM

## 2021-03-04 NOTE — Discharge Instructions (Addendum)
Keep splint on, clean and dry. Follow-up with hand specialist, referral given. Motrin and Tylenol as needed as directed for pain in your toe and your hand. Can apply ice to your toe for 20 minutes at a time as needed for pain.

## 2021-03-04 NOTE — ED Triage Notes (Signed)
Patient c/o right hand pain x 2 weeks. Patient states he hit a wall.  Patient also c/o right great toe toe and states he injured it at work 4 days ago.

## 2021-03-04 NOTE — ED Notes (Signed)
Discharge instructions reviewed with pt using teachback method. This RN reviewed follow up instructions with Ortho specialist and pain control. Pt denied any further questions. No further care needed at this time

## 2021-07-08 DIAGNOSIS — Z20822 Contact with and (suspected) exposure to covid-19: Secondary | ICD-10-CM | POA: Diagnosis not present

## 2021-10-06 IMAGING — CR DG CHEST 2V
2 series · 2 of 2 positions shown · non-contrast
Comparison: None.

CLINICAL DATA: Difficulty breathing x3 days, no CP Hx: Asthma as a
child, smoke occasionally

EXAM:
CHEST - 2 VIEW

[w chest pa]
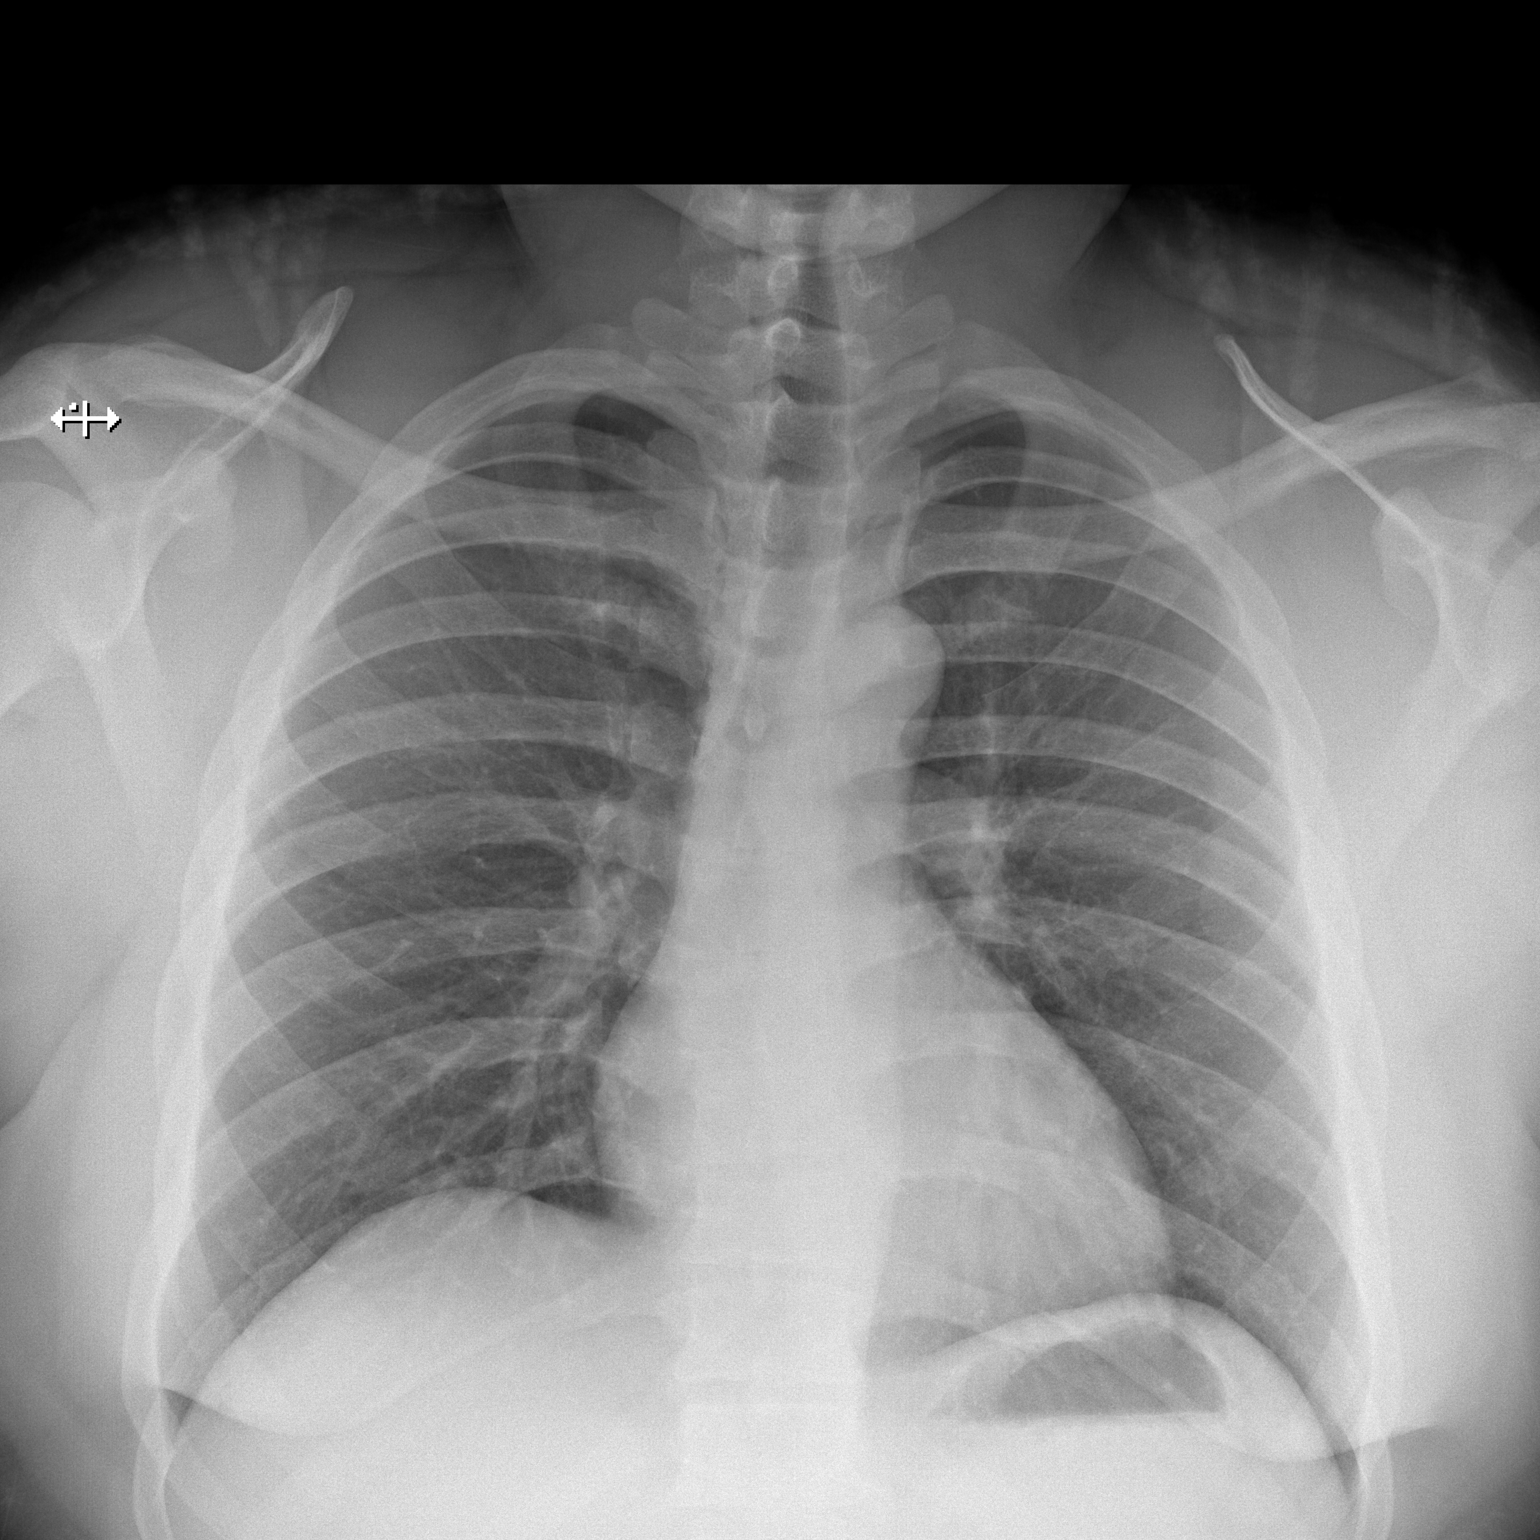

[w chest lat]
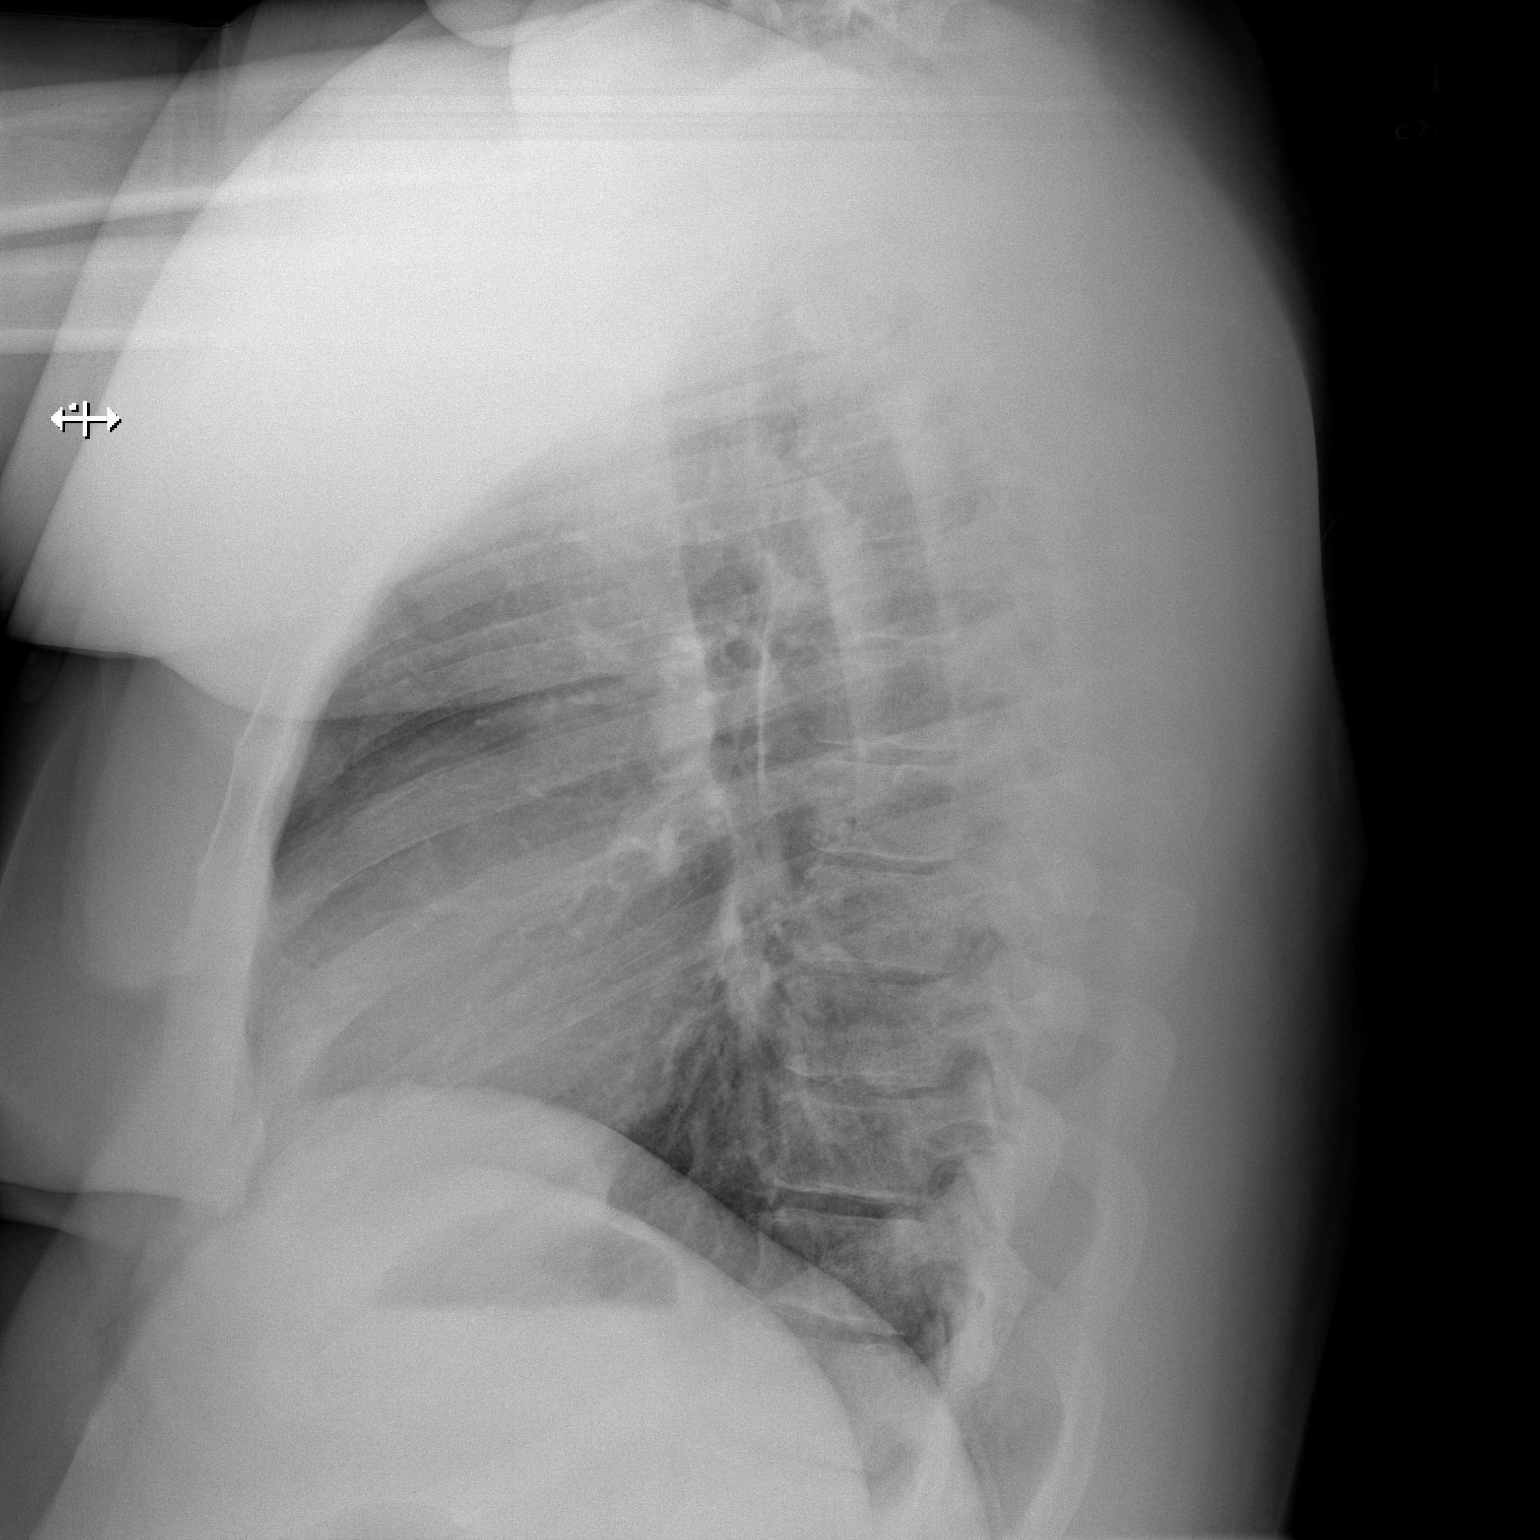

[2 of 2 positions shown; findings below may reference images not displayed]

FINDINGS: The cardiomediastinal contours are within normal limits. The lungs
are clear. No pneumothorax or pleural effusion. No acute finding in
the visualized skeleton.
IMPRESSION: No active cardiopulmonary disease.

## 2022-04-20 IMAGING — CR DG FOOT COMPLETE 3+V*R*
3 series · 3 of 3 positions shown · non-contrast
Comparison: None.

CLINICAL DATA: Right foot pain after injury 2 weeks ago.

EXAM:
RIGHT FOOT COMPLETE - 3+ VIEW

[x foot ap right]
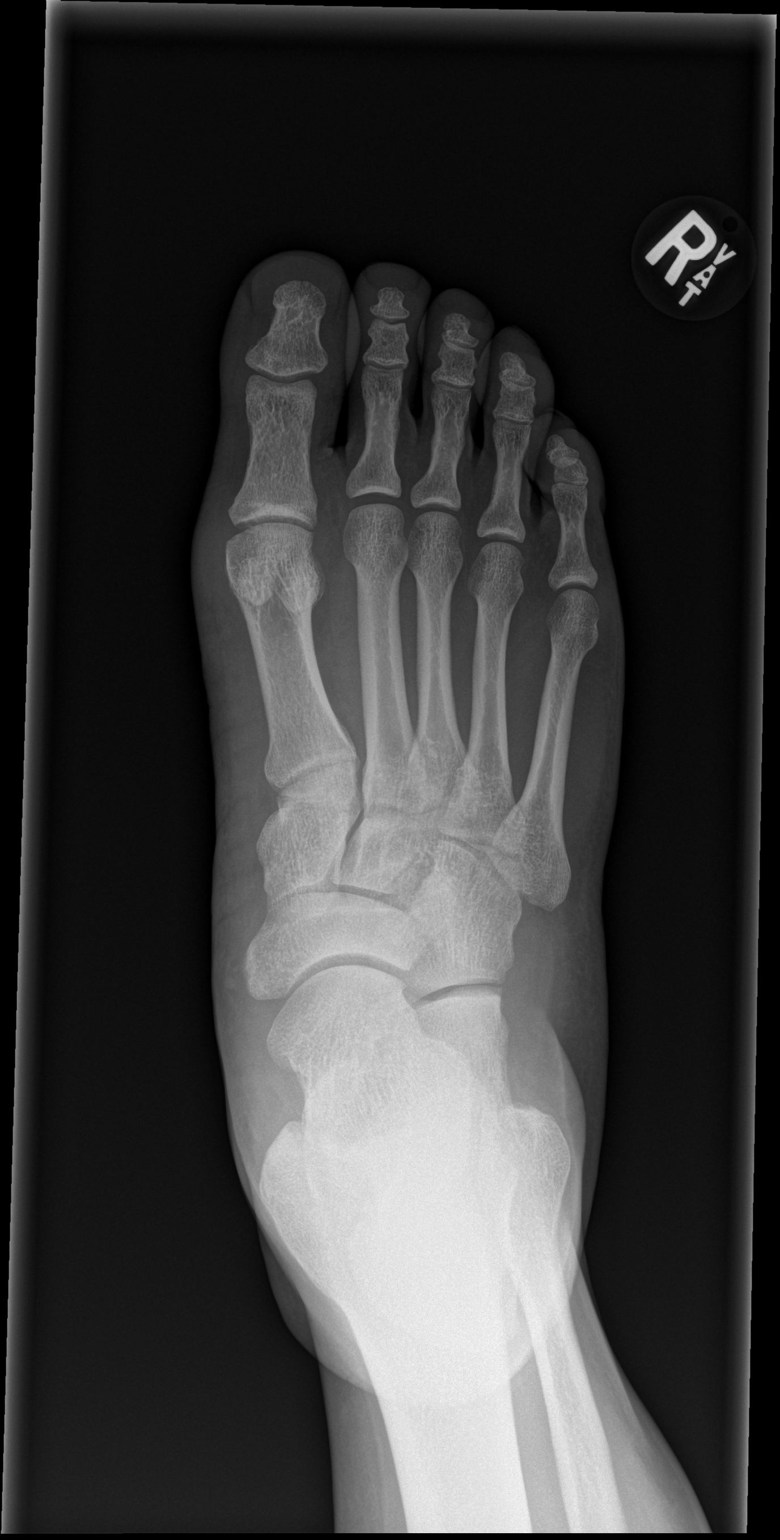

[x foot obl right]
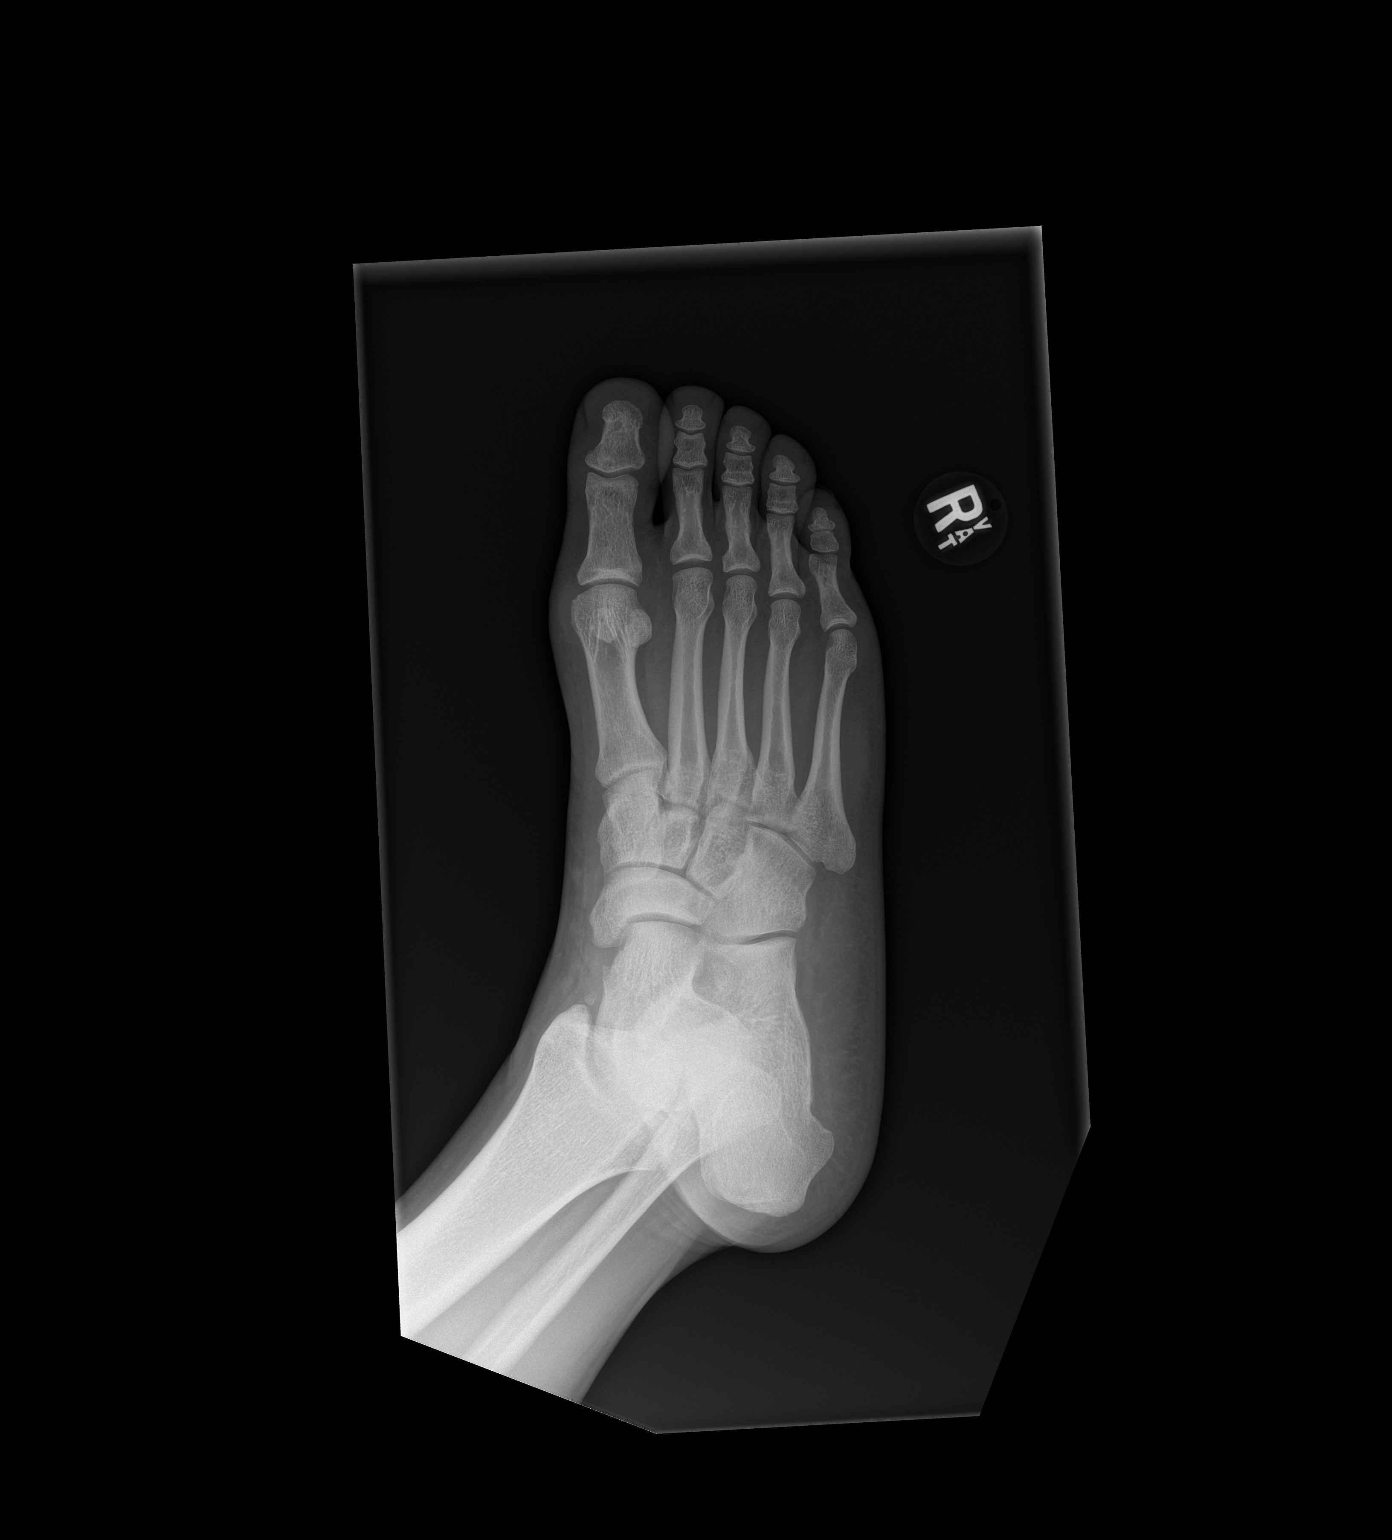

[x foot lat right]
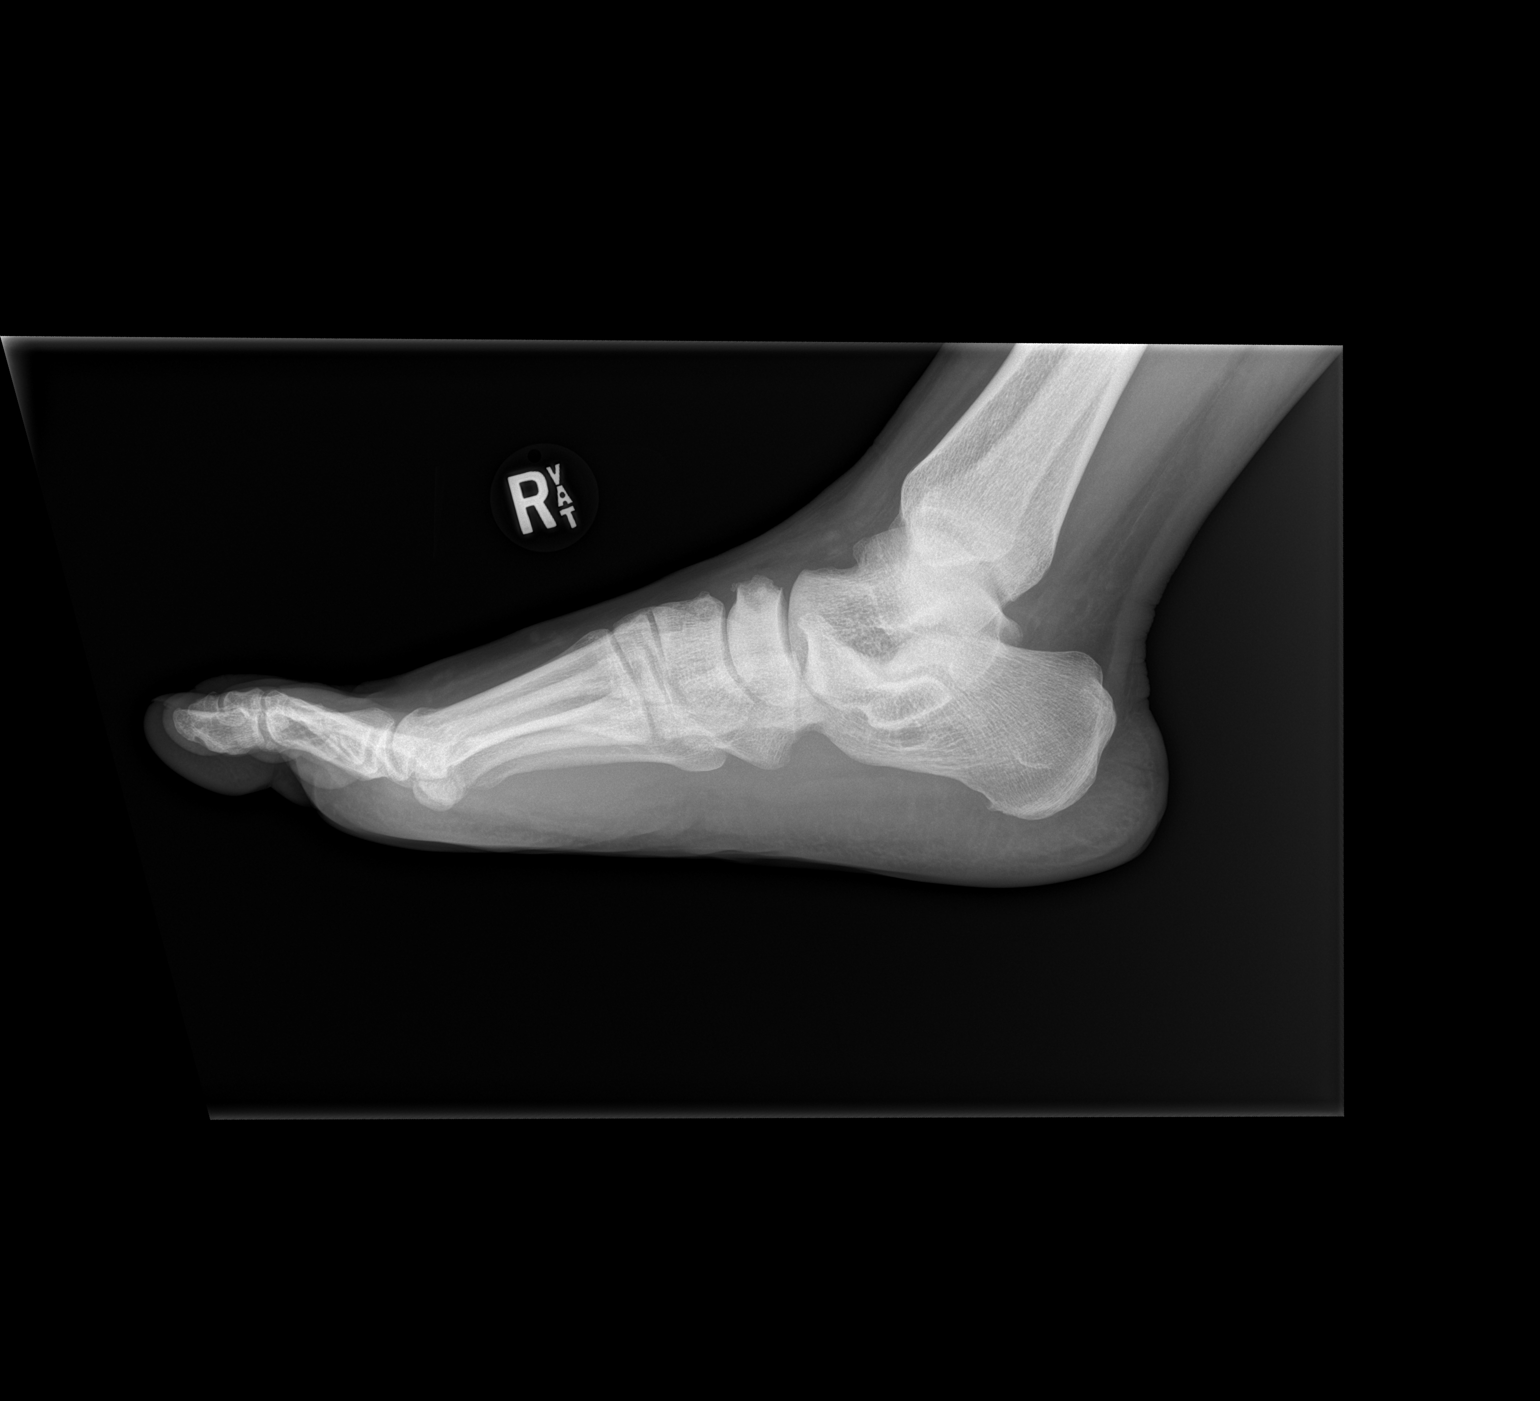

[3 of 3 positions shown; findings below may reference images not displayed]

FINDINGS: There is no evidence of fracture or dislocation. There is no
evidence of arthropathy or other focal bone abnormality. Soft
tissues are unremarkable.
IMPRESSION: Negative.

## 2022-04-20 IMAGING — CR DG HAND COMPLETE 3+V*R*
3 series · 3 of 3 positions shown · non-contrast
Comparison: None.

CLINICAL DATA: 35 y.o male c/o right hand pain x 2 weeks. Patient
states he hit a wall and c/o pain over the 5th metacarpal. Pt states
it has been swollen and he has been icing it. Patient also c/o right
great toe pain and states he injured it 4 days ago after
accidentally kicking a "stack" in the factory he works at.

EXAM:
RIGHT HAND - COMPLETE 3+ VIEW

[x hand pa right]
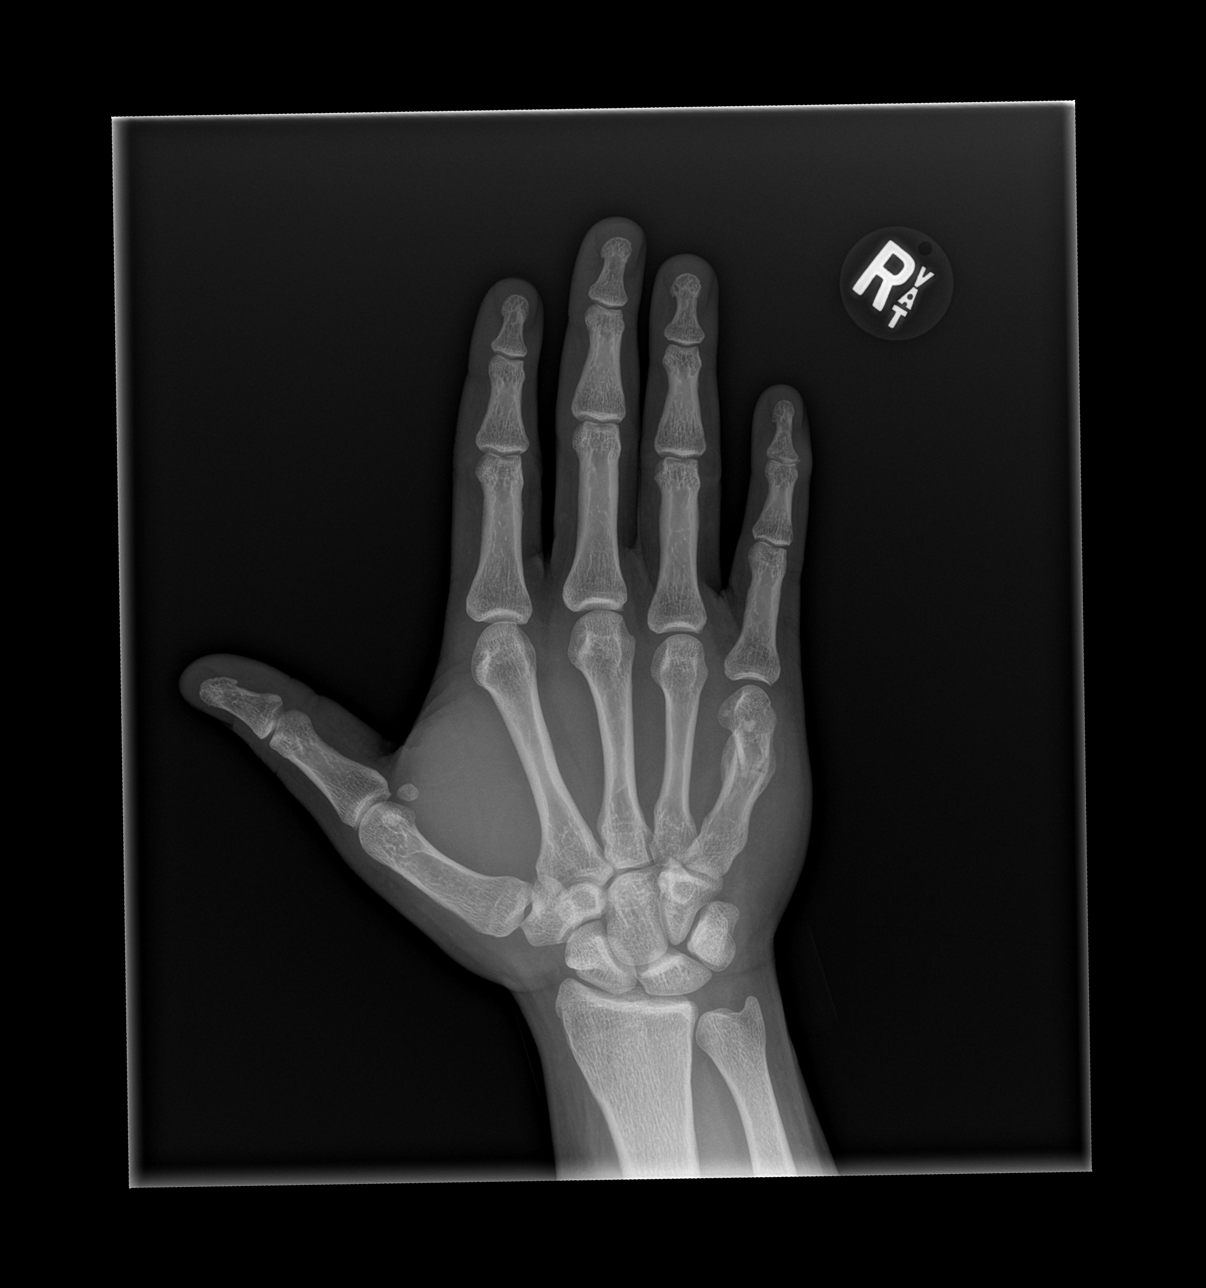

[x hand obl right]
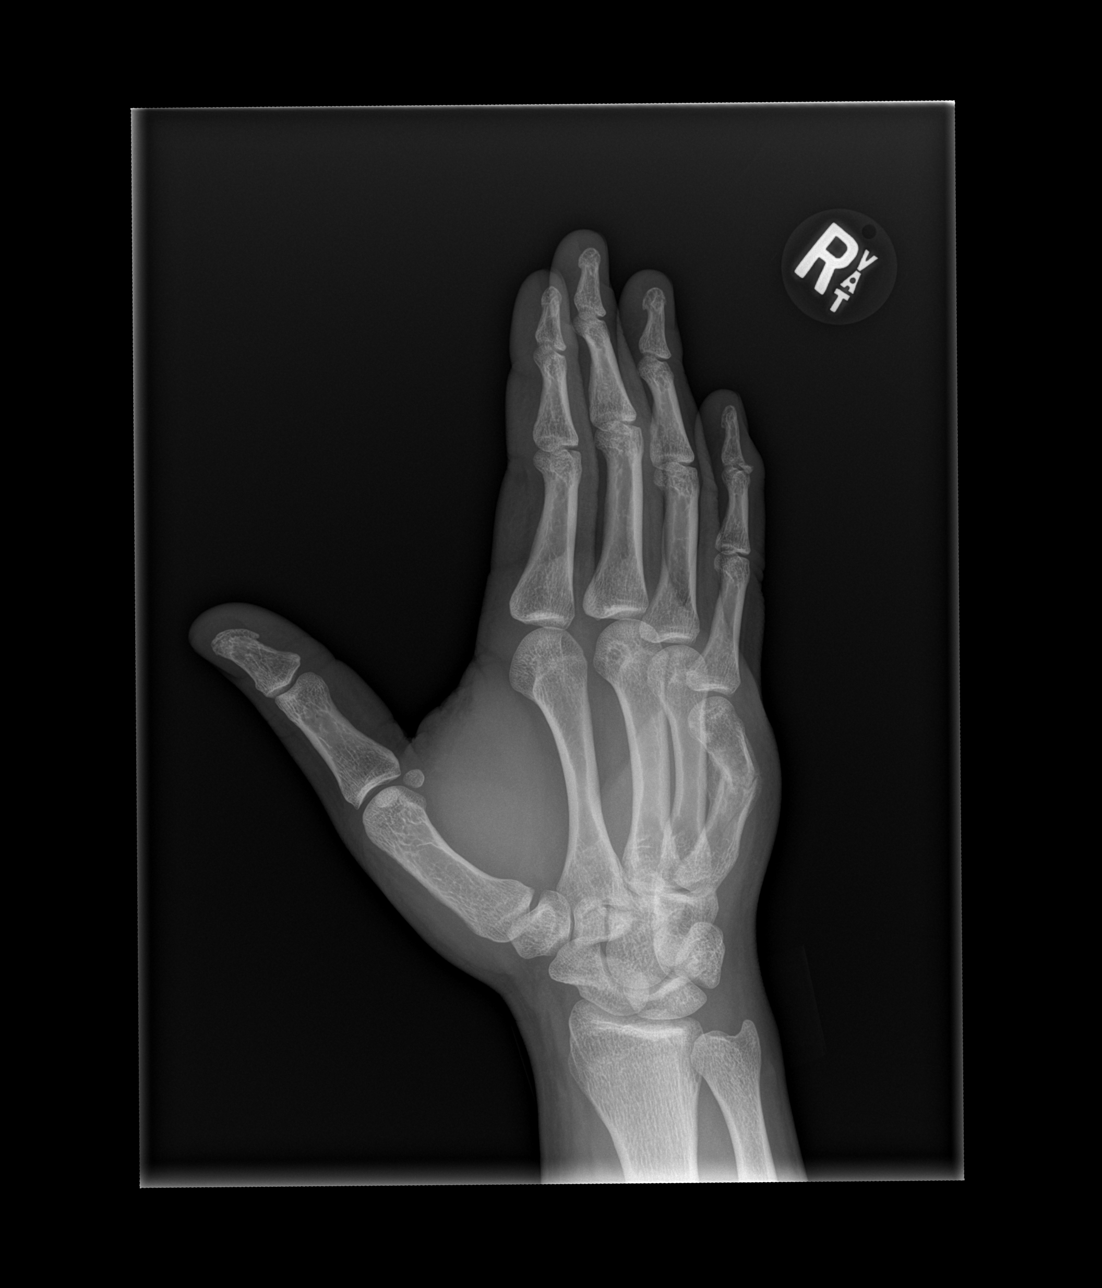

[x hand lat right]
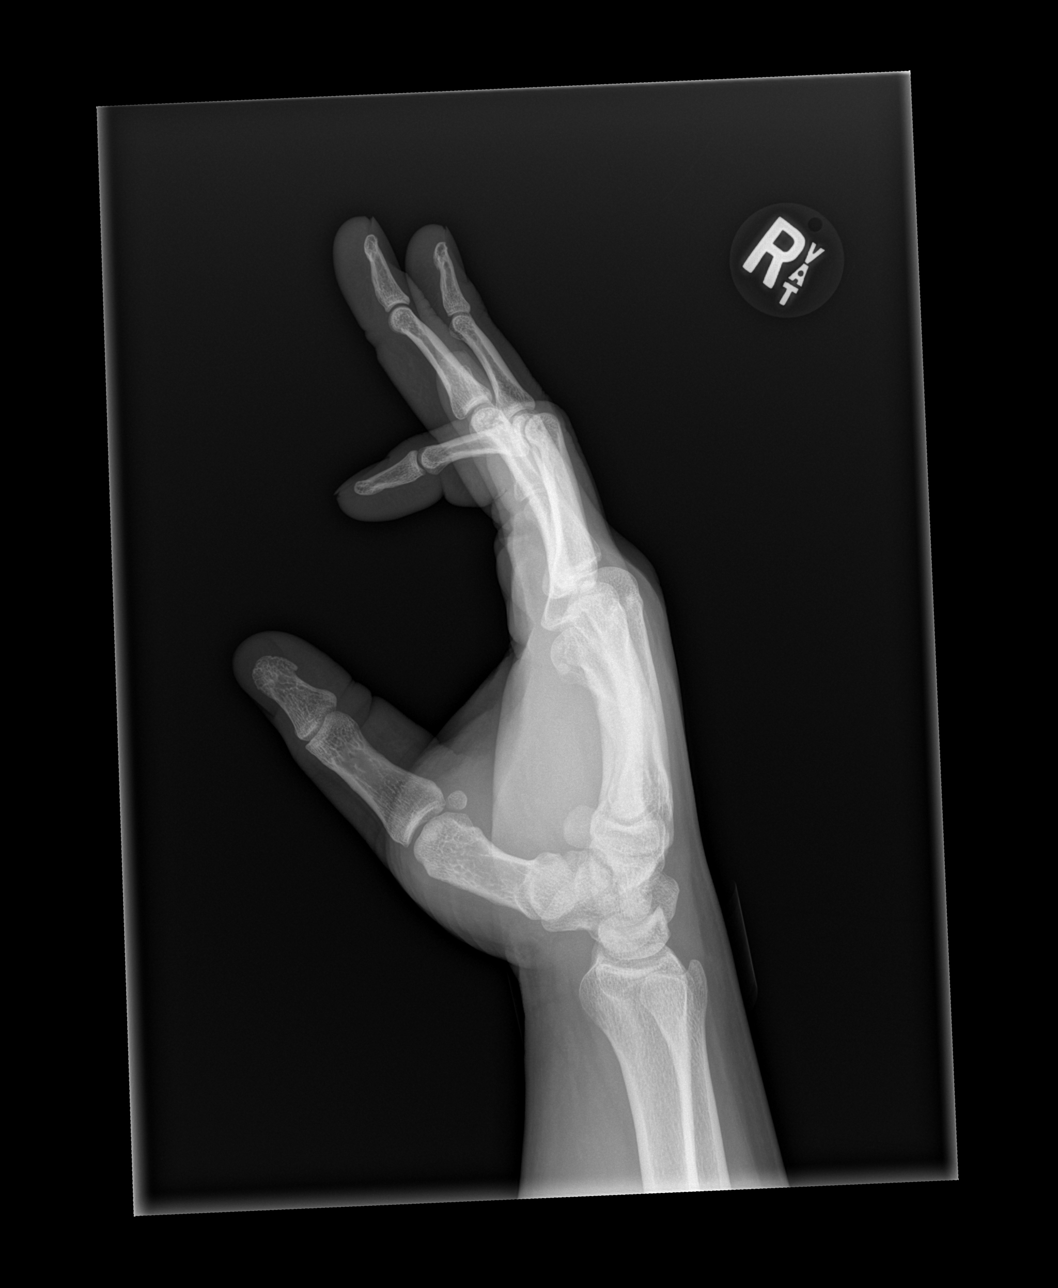

[3 of 3 positions shown; findings below may reference images not displayed]

FINDINGS: There changes from old fractures of the fifth metacarpal with mature
periosteal new bone extending from the mid to distal shaft, and a
more subtle area widening at the junction the metacarpal base and
shaft. There is a well-defined lucent line that crosses the
metacarpal shaft at the area of the old fracture, breaching the
dorsal ulnar cortical margin. Fractures angulated anterior over
radial direction, likely result of the old fracture.

There is a dorsal avulsion fracture from the base of the distal
phalanx of the fifth finger, non comminuted, minimally retracted
proximally by just over 1 mm.

No other fractures.

Joints are normally aligned.
IMPRESSION: 1. Findings consistent with an acute on chronic fracture of the
fifth metacarpal shaft as detailed. No fracture displacement.
Fracture angulation is likely related to the old fracture.
2. Avulsion fracture from the dorsal base of the distal phalanx of
the fifth finger of unclear chronicity.
3. No other fractures.  No dislocation.

## 2022-12-26 ENCOUNTER — Ambulatory Visit: Payer: Self-pay

## 2023-10-22 ENCOUNTER — Emergency Department (HOSPITAL_COMMUNITY)
Admission: EM | Admit: 2023-10-22 | Discharge: 2023-10-23 | Discharge status: Against Medical Advice | Payer: BC Managed Care – PPO | Attending: Emergency Medicine | Admitting: Emergency Medicine

## 2023-10-22 ENCOUNTER — Emergency Department (HOSPITAL_COMMUNITY): Payer: BC Managed Care – PPO

## 2023-10-22 DIAGNOSIS — R519 Headache, unspecified: Secondary | ICD-10-CM | POA: Insufficient documentation

## 2023-10-22 DIAGNOSIS — Z5321 Procedure and treatment not carried out due to patient leaving prior to being seen by health care provider: Secondary | ICD-10-CM | POA: Diagnosis not present

## 2023-10-22 DIAGNOSIS — Y92410 Unspecified street and highway as the place of occurrence of the external cause: Secondary | ICD-10-CM | POA: Diagnosis not present

## 2023-10-22 DIAGNOSIS — M25522 Pain in left elbow: Secondary | ICD-10-CM | POA: Diagnosis not present
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
## 2023-10-23 NOTE — ED Notes (Signed)
Pt stated he was leaving and was seen leaving the ED
